# Patient Record
Sex: Male | Born: 1990
Health system: Southern US, Community
[De-identification: ages and names within clinical notes are randomized; demographics above are authoritative.]

## PROBLEM LIST (undated history)

## (undated) DIAGNOSIS — B20 Human immunodeficiency virus [HIV] disease: Secondary | ICD-10-CM

## (undated) DIAGNOSIS — Z21 Asymptomatic human immunodeficiency virus [HIV] infection status: Secondary | ICD-10-CM

## (undated) DIAGNOSIS — F909 Attention-deficit hyperactivity disorder, unspecified type: Secondary | ICD-10-CM

## (undated) HISTORY — DX: Asymptomatic human immunodeficiency virus (hiv) infection status: Z21

## (undated) HISTORY — DX: Human immunodeficiency virus (HIV) disease: B20

## (undated) HISTORY — PX: RECTAL SURGERY: SHX760

## (undated) HISTORY — DX: Attention-deficit hyperactivity disorder, unspecified type: F90.9

---

## 2017-04-02 ENCOUNTER — Encounter (HOSPITAL_COMMUNITY): Payer: Self-pay | Admitting: Emergency Medicine

## 2017-04-02 DIAGNOSIS — N342 Other urethritis: Secondary | ICD-10-CM | POA: Insufficient documentation

## 2017-04-02 DIAGNOSIS — F17210 Nicotine dependence, cigarettes, uncomplicated: Secondary | ICD-10-CM | POA: Insufficient documentation

## 2017-04-02 NOTE — ED Triage Notes (Signed)
Pt states he has burning with urination and clear penile discharge x 3 days

## 2017-04-03 ENCOUNTER — Emergency Department (HOSPITAL_COMMUNITY)
Admission: EM | Admit: 2017-04-03 | Discharge: 2017-04-03 | Disposition: A | Discharge status: Home / Self Care | Payer: Self-pay | Attending: Emergency Medicine | Admitting: Emergency Medicine

## 2017-04-03 DIAGNOSIS — N342 Other urethritis: Secondary | ICD-10-CM

## 2017-04-03 LAB — URINALYSIS, ROUTINE W REFLEX MICROSCOPIC
BILIRUBIN URINE: NEGATIVE
Glucose, UA: NEGATIVE mg/dL
Ketones, ur: NEGATIVE mg/dL
Nitrite: NEGATIVE
PROTEIN: 100 mg/dL — AB
SPECIFIC GRAVITY, URINE: 1.02 (ref 1.005–1.030)
SQUAMOUS EPITHELIAL / LPF: NONE SEEN
pH: 6 (ref 5.0–8.0)

## 2017-04-03 MED ORDER — AZITHROMYCIN 250 MG PO TABS
1000.0000 mg | ORAL_TABLET | Freq: Once | ORAL | Status: AC
Start: 2017-04-03 — End: 2017-04-03
  Administered 2017-04-03: 1000 mg via ORAL
  Filled 2017-04-03: qty 4

## 2017-04-03 MED ORDER — LIDOCAINE HCL 1 % IJ SOLN
INTRAMUSCULAR | Status: AC
  Administered 2017-04-03: 1 mL
  Filled 2017-04-03: qty 20

## 2017-04-03 MED ORDER — CEFTRIAXONE SODIUM 250 MG IJ SOLR
250.0000 mg | Freq: Once | INTRAMUSCULAR | Status: AC
  Administered 2017-04-03: 250 mg via INTRAMUSCULAR
  Filled 2017-04-03: qty 250

## 2017-04-03 NOTE — ED Provider Notes (Signed)
[facility]-EMERGENCY DEPT Provider Note: Lowella DellJ. Lane Kaelum Kissick, MD, FACEP  CSN: [id number] MRN: [id number] ARRIVAL: 04/02/17 at 2317 ROOM: WA04/WA04   CHIEF COMPLAINT  Penile Discharge   HISTORY OF PRESENT ILLNESS  Brett Carter is a 26 y.o. male with a two-day history of urethral discharge and burning with urination. He rates the burning as an 8 out of 10. He denies abdominal pain, nausea or vomiting. He characterizes the symptoms as like previous gonorrhea.   History reviewed. No pertinent past medical history.  Past Surgical History:  Procedure Laterality Date  . RECTAL SURGERY      Family History  Problem Relation Age of Onset  . Diabetes Other   . Hypertension Other     Social History  Substance Use Topics  . Smoking status: Current Every Day Smoker    Types: Cigarettes  . Smokeless tobacco: Never Used  . Alcohol use Yes    Prior to Admission medications   Not on File    Allergies Depakote [valproic acid]; Lexapro [escitalopram oxalate]; Metadate cd [methylphenidate]; and Zyprexa [olanzapine]   REVIEW OF SYSTEMS  Negative except as noted here or in the History of Present Illness.   PHYSICAL EXAMINATION  Initial Vital Signs Blood pressure 132/86, pulse 79, temperature 98.6 F (37 C), temperature source Oral, resp. rate 18, SpO2 100 %.  Examination General: Well-developed, well-nourished male in no acute distress; appearance consistent with age of record HENT: normocephalic; atraumatic Eyes: pupils equal, round and reactive to light; extraocular muscles intact Neck: supple Heart: regular rate and rhythm Lungs: clear to auscultation bilaterally Abdomen: soft; nondistended; nontender; bowel sounds present GU: No CVA tenderness; tanner 5 male, circumcised; yellowish urethral discharge noted Extremities: No deformity; full range of motion Neurologic: Awake, alert and oriented; motor function intact in all extremities and symmetric; no facial droop Skin: Warm and  dry Psychiatric: Normal mood and affect   RESULTS  Summary of this visit's results, reviewed by myself:   EKG Interpretation  Date/Time:    Ventricular Rate:    PR Interval:    QRS Duration:   QT Interval:    QTC Calculation:   R Axis:     Text Interpretation:        Laboratory Studies: Results for orders placed or performed during the hospital encounter of 04/03/17 (from the past 24 hour(s))  Urinalysis, Routine w reflex microscopic     Status: Abnormal   Collection Time: 04/03/17 12:17 AM  Result Value Ref Range   Color, Urine YELLOW YELLOW   APPearance HAZY (A) CLEAR   Specific Gravity, Urine 1.020 1.005 - 1.030   pH 6.0 5.0 - 8.0   Glucose, UA NEGATIVE NEGATIVE mg/dL   Hgb urine dipstick SMALL (A) NEGATIVE   Bilirubin Urine NEGATIVE NEGATIVE   Ketones, ur NEGATIVE NEGATIVE mg/dL   Protein, ur 562100 (A) NEGATIVE mg/dL   Nitrite NEGATIVE NEGATIVE   Leukocytes, UA LARGE (A) NEGATIVE   RBC / HPF 6-30 0 - 5 RBC/hpf   WBC, UA TOO NUMEROUS TO COUNT 0 - 5 WBC/hpf   Bacteria, UA RARE (A) NONE SEEN   Squamous Epithelial / LPF NONE SEEN NONE SEEN   Mucous PRESENT    Imaging Studies: No results found.  ED COURSE  Nursing notes and initial vitals signs, including pulse oximetry, reviewed.  Vitals:   04/02/17 2348  BP: 132/86  Pulse: 79  Resp: 18  Temp: 98.6 F (37 C)  TempSrc: Oral  SpO2: 100%    PROCEDURES  ED DIAGNOSES     ICD-10-CM   1. Urethritis N34.2        Armeda Plumb, MD 04/03/17 1610

## 2017-04-04 LAB — URINE CULTURE: Culture: NO GROWTH

## 2017-04-04 LAB — GC/CHLAMYDIA PROBE AMP (~~LOC~~) NOT AT ARMC
Chlamydia: POSITIVE — AB
Neisseria Gonorrhea: POSITIVE — AB

## 2017-11-09 ENCOUNTER — Emergency Department (HOSPITAL_COMMUNITY)
Admission: EM | Admit: 2017-11-09 | Discharge: 2017-11-09 | Disposition: A | Discharge status: Home / Self Care | Payer: Self-pay | Attending: Emergency Medicine | Admitting: Emergency Medicine

## 2017-11-09 ENCOUNTER — Encounter (HOSPITAL_COMMUNITY): Payer: Self-pay | Admitting: Emergency Medicine

## 2017-11-09 DIAGNOSIS — R300 Dysuria: Secondary | ICD-10-CM | POA: Insufficient documentation

## 2017-11-09 DIAGNOSIS — R369 Urethral discharge, unspecified: Secondary | ICD-10-CM | POA: Insufficient documentation

## 2017-11-09 DIAGNOSIS — F17210 Nicotine dependence, cigarettes, uncomplicated: Secondary | ICD-10-CM | POA: Insufficient documentation

## 2017-11-09 LAB — URINALYSIS, ROUTINE W REFLEX MICROSCOPIC
BACTERIA UA: NONE SEEN
BILIRUBIN URINE: NEGATIVE
Glucose, UA: NEGATIVE mg/dL
Ketones, ur: NEGATIVE mg/dL
NITRITE: NEGATIVE
Protein, ur: 30 mg/dL — AB
SPECIFIC GRAVITY, URINE: 1.016 (ref 1.005–1.030)
pH: 6 (ref 5.0–8.0)

## 2017-11-09 MED ORDER — STERILE WATER FOR INJECTION IJ SOLN
INTRAMUSCULAR | Status: AC
  Administered 2017-11-09: 10 mL
  Filled 2017-11-09: qty 10

## 2017-11-09 MED ORDER — CEFTRIAXONE SODIUM 250 MG IJ SOLR
250.0000 mg | Freq: Once | INTRAMUSCULAR | Status: AC
  Administered 2017-11-09: 250 mg via INTRAMUSCULAR
  Filled 2017-11-09: qty 250

## 2017-11-09 MED ORDER — AZITHROMYCIN 250 MG PO TABS
1000.0000 mg | ORAL_TABLET | Freq: Once | ORAL | Status: AC
  Administered 2017-11-09: 1000 mg via ORAL
  Filled 2017-11-09: qty 4

## 2017-11-09 NOTE — Discharge Instructions (Signed)
The tests for gonorrhea and chlamydia were sent. It will take about 3 days for results to come back. If they are positive, your will receive a phone call. If positive, you do not need further treatment, as you were treated today.  You may follow up with the health department for any further concerns about STDs or penile discharge, they can test and treat for free.  Do not have unprotected sexual intercourse for 2 weeks, as this can cause reinfection.  Return to the ER if you develop fevers, chills, nausea, vomiting, or any new or concerning symptoms.

## 2017-11-09 NOTE — ED Provider Notes (Signed)
[facility]-EMERGENCY DEPT Provider Note   CSN: [id number] Arrival date & [id number]: 11/09/17  1338     History   Chief Complaint Chief Complaint  Patient presents with  . Penile Discharge    HPI Brett Carter is a 27 y.o. male presenting for evaluation of penile discharge.  Patient states he has had 3-4-day history of yellow penile discharge.  He also reports dysuria, although this is intermittent.  He denies fevers, chills, chest pain, shortness breath, nausea, vomiting, or abdominal pain.  He denies hematuria or urinary frequency.  He is concerned that he might have an STD or UTI.  He is sexually active with one partner who is male.  His partner also has penile discharge.  He denies being tested for STDs in the past, although reports he has had treatment for gonorrhea in the past.  He does not want HIV or syphilis testing today.  He denies any other medical problems.  HPI  History reviewed. No pertinent past medical history.  There are no active problems to display for this patient.   Past Surgical History:  Procedure Laterality Date  . RECTAL SURGERY         Home Medications    Prior to Admission medications   Not on File    Family History Family History  Problem Relation Age of Onset  . Diabetes Other   . Hypertension Other     Social History Social History   Tobacco Use  . Smoking status: Current Every Day Smoker    Types: Cigarettes  . Smokeless tobacco: Never Used  Substance Use Topics  . Alcohol use: Yes  . Drug use: No     Allergies   Depakote [valproic acid]; Lexapro [escitalopram oxalate]; Metadate cd [methylphenidate]; and Zyprexa [olanzapine]   Review of Systems Review of Systems  Constitutional: Negative for chills and fever.  Genitourinary: Positive for discharge and dysuria. Negative for frequency, hematuria, penile pain, penile swelling, scrotal swelling and testicular pain.     Physical Exam Updated Vital  Signs BP 121/78 (BP Location: Right Arm)   Pulse 79   Temp 98.5 F (36.9 C) (Oral)   Resp 14   Ht 5\' 8"  (1.727 m)   Wt 72.6 kg (160 lb)   SpO2 99%   BMI 24.33 kg/m   Physical Exam  Constitutional: He is oriented to person, place, and time. He appears well-developed and well-nourished. No distress.  HENT:  Head: Normocephalic and atraumatic.  OP clear without erythema or exudate  Eyes: EOM are normal.  Neck: Normal range of motion.  Cardiovascular: Normal rate, regular rhythm and intact distal pulses.  Pulmonary/Chest: Effort normal and breath sounds normal. No respiratory distress. He has no wheezes.  Abdominal: Soft. He exhibits no distension and no mass. There is no tenderness. There is no guarding.  Genitourinary:  Genitourinary Comments: Chaperone present.  White discharge visualized from the penis.  No inguinal lymphadenopathy.  No penile or testicular swelling.  Musculoskeletal: Normal range of motion.  Neurological: He is alert and oriented to person, place, and time.  Skin: Skin is warm and dry.  Psychiatric: He has a normal mood and affect.  Nursing note and vitals reviewed.    ED Treatments / Results  Labs (all labs ordered are listed, but only abnormal results are displayed) Labs Reviewed  URINALYSIS, ROUTINE W REFLEX MICROSCOPIC - Abnormal; Notable for the following components:      Result Value   Hgb urine dipstick SMALL (*)  Protein, ur 30 (*)    Leukocytes, UA MODERATE (*)    Squamous Epithelial / LPF 0-5 (*)    All other components within normal limits  URINE CULTURE  GC/CHLAMYDIA PROBE AMP () NOT AT [facility]    EKG  EKG Interpretation None       Radiology No results found.  Procedures Procedures (including critical care time)  Medications Ordered in ED Medications  cefTRIAXone (ROCEPHIN) injection 250 mg (250 mg Intramuscular Given 11/09/17 1626)  azithromycin (ZITHROMAX) tablet 1,000 mg (1,000 mg Oral Given 11/09/17 1625)    sterile water (preservative free) injection (10 mLs  Given 11/09/17 1625)     Initial Impression / Assessment and Plan / ED Course  I have reviewed the triage vital signs and the nursing notes.  Pertinent labs & imaging results that were available during my care of the patient were reviewed by me and considered in my medical decision making (see chart for details).      Pt presenting with concerns for possible STD.  Discharge noted on exam.  UA obtained.  Pt understands that they have GC/Chlamydia cultures pending and that they will need to inform all sexual partners if results return positive. Pt has been treated prophylactically with azithromycin and Rocephin due to pts history and physical.   UA negative for infection.  Discussed with patient.  Patient to be discharged with instructions to follow up with health department. Discussed importance of using protection when sexually active.  At this time, patient appears safe for discharge.  Return precautions given.  Patient states he understands and agrees to plan.  Final Clinical Impressions(s) / ED Diagnoses   Final diagnoses:  Penile discharge  Dysuria    ED Discharge Orders    None       Alveria Apley, PA-C 11/09/17 2220    Maia Plan, MD 11/10/17 1106

## 2017-11-09 NOTE — ED Triage Notes (Signed)
Pt comes in with complaints of penile discharge that began 3-4 days ago.  Endorses dysuria. Last intercourse 7-8 days ago.  Pt ambulatory. A&O x4. No other complaints at this time.

## 2017-11-11 LAB — URINE CULTURE: Culture: NO GROWTH

## 2018-03-15 ENCOUNTER — Encounter (HOSPITAL_COMMUNITY): Payer: Self-pay | Admitting: Emergency Medicine

## 2018-03-15 ENCOUNTER — Emergency Department (HOSPITAL_COMMUNITY): Payer: Self-pay

## 2018-03-15 ENCOUNTER — Other Ambulatory Visit: Payer: Self-pay

## 2018-03-15 ENCOUNTER — Inpatient Hospital Stay (HOSPITAL_COMMUNITY)
Admission: EM | Admit: 2018-03-15 | Discharge: 2018-03-17 | DRG: 195 | Disposition: A | Discharge status: Home / Self Care | Payer: Self-pay | Attending: Internal Medicine | Admitting: Internal Medicine

## 2018-03-15 DIAGNOSIS — Z23 Encounter for immunization: Secondary | ICD-10-CM

## 2018-03-15 DIAGNOSIS — R1310 Dysphagia, unspecified: Secondary | ICD-10-CM | POA: Diagnosis present

## 2018-03-15 DIAGNOSIS — F329 Major depressive disorder, single episode, unspecified: Secondary | ICD-10-CM

## 2018-03-15 DIAGNOSIS — J181 Lobar pneumonia, unspecified organism: Principal | ICD-10-CM | POA: Diagnosis present

## 2018-03-15 DIAGNOSIS — Z833 Family history of diabetes mellitus: Secondary | ICD-10-CM

## 2018-03-15 DIAGNOSIS — Z21 Asymptomatic human immunodeficiency virus [HIV] infection status: Secondary | ICD-10-CM | POA: Diagnosis present

## 2018-03-15 DIAGNOSIS — Z8249 Family history of ischemic heart disease and other diseases of the circulatory system: Secondary | ICD-10-CM

## 2018-03-15 DIAGNOSIS — E860 Dehydration: Secondary | ICD-10-CM | POA: Diagnosis present

## 2018-03-15 DIAGNOSIS — E876 Hypokalemia: Secondary | ICD-10-CM | POA: Diagnosis present

## 2018-03-15 DIAGNOSIS — J189 Pneumonia, unspecified organism: Secondary | ICD-10-CM

## 2018-03-15 DIAGNOSIS — F17210 Nicotine dependence, cigarettes, uncomplicated: Secondary | ICD-10-CM | POA: Diagnosis present

## 2018-03-15 DIAGNOSIS — B20 Human immunodeficiency virus [HIV] disease: Secondary | ICD-10-CM

## 2018-03-15 DIAGNOSIS — Z888 Allergy status to other drugs, medicaments and biological substances status: Secondary | ICD-10-CM

## 2018-03-15 DIAGNOSIS — R000 Tachycardia, unspecified: Secondary | ICD-10-CM | POA: Diagnosis present

## 2018-03-15 DIAGNOSIS — Z8619 Personal history of other infectious and parasitic diseases: Secondary | ICD-10-CM

## 2018-03-15 LAB — COMPREHENSIVE METABOLIC PANEL
ALT: 22 U/L (ref 17–63)
AST: 35 U/L (ref 15–41)
Albumin: 3.5 g/dL (ref 3.5–5.0)
Alkaline Phosphatase: 47 U/L (ref 38–126)
Anion gap: 10 (ref 5–15)
BUN: 7 mg/dL (ref 6–20)
CHLORIDE: 100 mmol/L — AB (ref 101–111)
CO2: 28 mmol/L (ref 22–32)
Calcium: 8.7 mg/dL — ABNORMAL LOW (ref 8.9–10.3)
Creatinine, Ser: 1.09 mg/dL (ref 0.61–1.24)
Glucose, Bld: 98 mg/dL (ref 65–99)
POTASSIUM: 3 mmol/L — AB (ref 3.5–5.1)
Sodium: 138 mmol/L (ref 135–145)
Total Bilirubin: 0.8 mg/dL (ref 0.3–1.2)
Total Protein: 8.4 g/dL — ABNORMAL HIGH (ref 6.5–8.1)

## 2018-03-15 LAB — I-STAT CG4 LACTIC ACID, ED: LACTIC ACID, VENOUS: 1.54 mmol/L (ref 0.5–1.9)

## 2018-03-15 LAB — URINALYSIS, ROUTINE W REFLEX MICROSCOPIC
BILIRUBIN URINE: NEGATIVE
Glucose, UA: NEGATIVE mg/dL
KETONES UR: NEGATIVE mg/dL
Leukocytes, UA: NEGATIVE
NITRITE: NEGATIVE
PROTEIN: 30 mg/dL — AB
Specific Gravity, Urine: 1.01 (ref 1.005–1.030)
pH: 6 (ref 5.0–8.0)

## 2018-03-15 LAB — LACTATE DEHYDROGENASE: LDH: 212 U/L — AB (ref 98–192)

## 2018-03-15 LAB — RESPIRATORY PANEL BY PCR
Adenovirus: NOT DETECTED
BORDETELLA PERTUSSIS-RVPCR: NOT DETECTED
CHLAMYDOPHILA PNEUMONIAE-RVPPCR: NOT DETECTED
CORONAVIRUS HKU1-RVPPCR: NOT DETECTED
CORONAVIRUS NL63-RVPPCR: NOT DETECTED
Coronavirus 229E: NOT DETECTED
Coronavirus OC43: NOT DETECTED
Influenza A: NOT DETECTED
Influenza B: NOT DETECTED
MYCOPLASMA PNEUMONIAE-RVPPCR: NOT DETECTED
Metapneumovirus: NOT DETECTED
Parainfluenza Virus 1: NOT DETECTED
Parainfluenza Virus 2: NOT DETECTED
Parainfluenza Virus 3: NOT DETECTED
Parainfluenza Virus 4: NOT DETECTED
RHINOVIRUS / ENTEROVIRUS - RVPPCR: NOT DETECTED
Respiratory Syncytial Virus: NOT DETECTED

## 2018-03-15 LAB — CBC
HCT: 30.3 % — ABNORMAL LOW (ref 39.0–52.0)
HEMATOCRIT: 32.3 % — AB (ref 39.0–52.0)
HEMOGLOBIN: 9.9 g/dL — AB (ref 13.0–17.0)
Hemoglobin: 10.6 g/dL — ABNORMAL LOW (ref 13.0–17.0)
MCH: 28 pg (ref 26.0–34.0)
MCH: 28 pg (ref 26.0–34.0)
MCHC: 32.8 g/dL (ref 30.0–36.0)
MCHC: 32.7 g/dL (ref 30.0–36.0)
MCV: 85.2 fL (ref 78.0–100.0)
MCV: 85.8 fL (ref 78.0–100.0)
PLATELETS: 218 10*3/uL (ref 150–400)
Platelets: 197 10*3/uL (ref 150–400)
RBC: 3.79 MIL/uL — AB (ref 4.22–5.81)
RBC: 3.53 MIL/uL — ABNORMAL LOW (ref 4.22–5.81)
RDW: 12.7 % (ref 11.5–15.5)
RDW: 12.6 % (ref 11.5–15.5)
WBC: 7.6 10*3/uL (ref 4.0–10.5)
WBC: 8.4 10*3/uL (ref 4.0–10.5)

## 2018-03-15 LAB — RAPID HIV SCREEN (HIV 1/2 AB+AG)
HIV 1/2 ANTIBODIES: REACTIVE — AB
HIV-1 P24 Antigen - HIV24: NONREACTIVE

## 2018-03-15 LAB — LIPASE, BLOOD: LIPASE: 34 U/L (ref 11–51)

## 2018-03-15 LAB — CREATININE, SERUM: Creatinine, Ser: 1.05 mg/dL (ref 0.61–1.24)

## 2018-03-15 LAB — MAGNESIUM: MAGNESIUM: 1.5 mg/dL — AB (ref 1.7–2.4)

## 2018-03-15 LAB — URINALYSIS, MICROSCOPIC (REFLEX)
BACTERIA UA: NONE SEEN
RBC / HPF: NONE SEEN RBC/hpf (ref 0–5)
Squamous Epithelial / LPF: NONE SEEN (ref 0–5)

## 2018-03-15 LAB — MONONUCLEOSIS SCREEN: Mono Screen: NEGATIVE

## 2018-03-15 LAB — GROUP A STREP BY PCR: GROUP A STREP BY PCR: NOT DETECTED

## 2018-03-15 LAB — TROPONIN I: Troponin I: <0.03 ng/mL (ref <0.03)

## 2018-03-15 MED ORDER — PNEUMOCOCCAL VAC POLYVALENT 25 MCG/0.5ML IJ INJ
0.5000 mL | INJECTION | INTRAMUSCULAR | Status: AC
  Administered 2018-03-16: 0.5 mL via INTRAMUSCULAR
  Filled 2018-03-15: qty 0.5

## 2018-03-15 MED ORDER — AZITHROMYCIN 500 MG IV SOLR
500.0000 mg | INTRAVENOUS | Status: DC
  Administered 2018-03-16 – 2018-03-17 (×2): 500 mg via INTRAVENOUS
  Filled 2018-03-15 (×2): qty 500

## 2018-03-15 MED ORDER — SODIUM CHLORIDE 0.9 % IV SOLN
1.0000 g | Freq: Once | INTRAVENOUS | Status: AC
  Administered 2018-03-15: 1 g via INTRAVENOUS
  Filled 2018-03-15: qty 10

## 2018-03-15 MED ORDER — SODIUM CHLORIDE 0.9 % IV SOLN
INTRAVENOUS | Status: DC
Start: 2018-03-15 — End: 2018-03-17
  Administered 2018-03-15 – 2018-03-17 (×4): via INTRAVENOUS

## 2018-03-15 MED ORDER — POTASSIUM CHLORIDE 10 MEQ/100ML IV SOLN
10.0000 meq | INTRAVENOUS | Status: AC
  Administered 2018-03-15 – 2018-03-16 (×6): 10 meq via INTRAVENOUS
  Filled 2018-03-15 (×5): qty 100

## 2018-03-15 MED ORDER — IPRATROPIUM-ALBUTEROL 0.5-2.5 (3) MG/3ML IN SOLN: 3.0000 mL | Freq: Two times a day (BID) | RESPIRATORY_TRACT | Status: DC

## 2018-03-15 MED ORDER — SODIUM CHLORIDE 0.9 % IV BOLUS
2000.0000 mL | Freq: Once | INTRAVENOUS | Status: AC
  Administered 2018-03-15: 2000 mL via INTRAVENOUS

## 2018-03-15 MED ORDER — KETOROLAC TROMETHAMINE 30 MG/ML IJ SOLN
30.0000 mg | Freq: Four times a day (QID) | INTRAMUSCULAR | Status: DC | PRN
  Administered 2018-03-15: 30 mg via INTRAVENOUS
  Filled 2018-03-15: qty 1

## 2018-03-15 MED ORDER — ENOXAPARIN SODIUM 40 MG/0.4ML ~~LOC~~ SOLN
40.0000 mg | SUBCUTANEOUS | Status: DC
  Filled 2018-03-15: qty 0.4

## 2018-03-15 MED ORDER — BICTEGRAVIR-EMTRICITAB-TENOFOV 50-200-25 MG PO TABS
1.0000 | ORAL_TABLET | Freq: Every day | ORAL | Status: DC
  Administered 2018-03-15 – 2018-03-17 (×3): 1 via ORAL
  Filled 2018-03-15 (×3): qty 1

## 2018-03-15 MED ORDER — ACETAMINOPHEN 325 MG PO TABS
650.0000 mg | ORAL_TABLET | Freq: Once | ORAL | Status: AC | PRN
  Administered 2018-03-15: 650 mg via ORAL
  Filled 2018-03-15: qty 2

## 2018-03-15 MED ORDER — SODIUM CHLORIDE 0.9 % IV SOLN
500.0000 mg | Freq: Once | INTRAVENOUS | Status: AC
  Administered 2018-03-15: 500 mg via INTRAVENOUS
  Filled 2018-03-15: qty 500

## 2018-03-15 MED ORDER — ACETAMINOPHEN 325 MG PO TABS
650.0000 mg | ORAL_TABLET | Freq: Four times a day (QID) | ORAL | Status: DC | PRN
  Administered 2018-03-15 – 2018-03-17 (×2): 650 mg via ORAL
  Filled 2018-03-15 (×2): qty 2

## 2018-03-15 MED ORDER — SODIUM CHLORIDE 0.9 % IV BOLUS
1000.0000 mL | Freq: Once | INTRAVENOUS | Status: AC
  Administered 2018-03-15: 1000 mL via INTRAVENOUS

## 2018-03-15 MED ORDER — ONDANSETRON HCL 4 MG/2ML IJ SOLN: 4.0000 mg | Freq: Four times a day (QID) | INTRAMUSCULAR | Status: DC | PRN

## 2018-03-15 MED ORDER — IPRATROPIUM-ALBUTEROL 0.5-2.5 (3) MG/3ML IN SOLN
3.0000 mL | Freq: Four times a day (QID) | RESPIRATORY_TRACT | Status: DC
  Administered 2018-03-15: 3 mL via RESPIRATORY_TRACT
  Filled 2018-03-15: qty 3

## 2018-03-15 MED ORDER — GUAIFENESIN ER 600 MG PO TB12
600.0000 mg | ORAL_TABLET | Freq: Two times a day (BID) | ORAL | Status: DC
  Administered 2018-03-15 – 2018-03-17 (×4): 600 mg via ORAL
  Filled 2018-03-15 (×5): qty 1

## 2018-03-15 MED ORDER — SODIUM CHLORIDE 0.9 % IV SOLN
1.0000 g | INTRAVENOUS | Status: DC
  Administered 2018-03-16 – 2018-03-17 (×2): 1 g via INTRAVENOUS
  Filled 2018-03-15 (×2): qty 1

## 2018-03-15 MED ORDER — MAGNESIUM OXIDE 400 (241.3 MG) MG PO TABS
800.0000 mg | ORAL_TABLET | ORAL | Status: AC
  Administered 2018-03-15 – 2018-03-16 (×2): 800 mg via ORAL
  Filled 2018-03-15 (×2): qty 2

## 2018-03-15 NOTE — ED Notes (Signed)
Urinal @ bedside. Pt has been made aware of UA sample

## 2018-03-15 NOTE — H&P (Signed)
History and Physical    Brett Carter BJY:[id number] DOB: [birth year] DOA: 03/15/2018  PCP: Patient, No Pcp Per   Patient coming from: Home    Chief Complaint: chest pain, fever and chills  HPI: Brett Carter is a 27 y.o. male with no significant past medical history who came with complaints of fever, chills and chest pain since last 3 weeks.  Patient says he has been having these symptoms on and off since last 3 weeks.  The symptoms have gradually worsened for last few days.  He denies any sick contacts.  He is sexually active with men and says 1 of his partners has HIV.  He admits of having unprotected intercourse.  It was reported that patient has history of urethritis several months ago and was treated for chlamydia.  Patient also complains of severe midsternal chest pain which get worsens with movement and cough.  He also reports of having productive cough with blood-tinged sputum.  Patient lives with his friends and does not work.  He smokes about 3 to 4 cigarettes a day and occasionally drinks.  He denies any history of drug abuse. He complains of continuous chest pain  during my evaluation.  Was continuously coughing.  Denies any nausea, vomiting.  Says that he has few episodes of diarrhea.  Denies any abdominal pain, headache or dysuria.  ED Course: Patient look pretty sick on presentation.  He was febrile and tachycardic.  Blood pressure was stable on presentation.  Continues to complain of chest pain.  Patient was given IV fluids.  Started on antibiotics.  Chest x-ray showed left lower lobe pneumonia.  HIV rapid screening was positive.  ID consulted.  Review of Systems: As per HPI otherwise 10 point review of systems negative.    History reviewed. No pertinent past medical history.  Past Surgical History:  Procedure Laterality Date  . RECTAL SURGERY       reports that he has been smoking cigarettes.  He has never used smokeless tobacco. He reports that he drinks alcohol. He  reports that he does not use drugs.  Allergies  Allergen Reactions  . Depakote [Valproic Acid]   . Lexapro [Escitalopram Oxalate]   . Metadate Cd [Methylphenidate]   . Zyprexa [Olanzapine]     Family History  Problem Relation Age of Onset  . Diabetes Other   . Hypertension Other      Prior to Admission medications   Not on File    Physical Exam: Vitals:   03/15/18 1330 03/15/18 1400 03/15/18 1409 03/15/18 1430  BP: 125/88 129/90  (!) 123/94  Pulse: (!) 116 (!) 114  (!) 118  Resp: (!) 28 (!) 31  (!) 33  Temp:   (!) 102.8 F (39.3 C)   TempSrc:   Oral   SpO2: 97% 98%  94%    Constitutional: NAD, calm, comfortable Vitals:   03/15/18 1330 03/15/18 1400 03/15/18 1409 03/15/18 1430  BP: 125/88 129/90  (!) 123/94  Pulse: (!) 116 (!) 114  (!) 118  Resp: (!) 28 (!) 31  (!) 33  Temp:   (!) 102.8 F (39.3 C)   TempSrc:   Oral   SpO2: 97% 98%  94%   General: Sick looking Eyes: PERRL, lids and conjunctivae normal ENMT: Mucous membranes are moist. Posterior pharynx clear of any exudate or lesions.Normal dentition.  Neck: normal, supple, no masses, no thyromegaly Respiratory: Decreased air entry, basal crackles on the left lung. normal respiratory effort. No accessory muscle use.  Cardiovascular: Regular  rate and rhythm, no murmurs / rubs / gallops. No extremity edema. 2+ pedal pulses. No carotid bruits.  Abdomen: no tenderness, no masses palpated. No hepatosplenomegaly. Bowel sounds positive.  Musculoskeletal: no clubbing / cyanosis. No joint deformity upper and lower extremities. Good ROM, no contractures. Normal muscle tone.  Skin: no rashes, lesions, ulcers. No induration Neurologic: CN 2-12 grossly intact. Sensation intact, DTR normal. Strength 5/5 in all 4.  Psychiatric: Normal judgment and insight. Alert and oriented x 3. Normal mood.   Foley Catheter:None  Labs on Admission: I have personally reviewed following labs and imaging studies  CBC: Recent Labs  Lab  03/15/18 1142  WBC 7.6  HGB 10.6*  HCT 32.3*  MCV 85.2  PLT 218   Basic Metabolic Panel: Recent Labs  Lab 03/15/18 1142  NA 138  K 3.0*  CL 100*  CO2 28  GLUCOSE 98  BUN 7  CREATININE 1.09  CALCIUM 8.7*   GFR: CrCl cannot be calculated (Unknown ideal weight.). Liver Function Tests: Recent Labs  Lab 03/15/18 1142  AST 35  ALT 22  ALKPHOS 47  BILITOT 0.8  PROT 8.4*  ALBUMIN 3.5   Recent Labs  Lab 03/15/18 1142  LIPASE 34   No results for input(s): AMMONIA in the last 168 hours. Coagulation Profile: No results for input(s): INR, PROTIME in the last 168 hours. Cardiac Enzymes: No results for input(s): CKTOTAL, CKMB, CKMBINDEX, TROPONINI in the last 168 hours. BNP (last 3 results) No results for input(s): PROBNP in the last 8760 hours. HbA1C: No results for input(s): HGBA1C in the last 72 hours. CBG: No results for input(s): GLUCAP in the last 168 hours. Lipid Profile: No results for input(s): CHOL, HDL, LDLCALC, TRIG, CHOLHDL, LDLDIRECT in the last 72 hours. Thyroid Function Tests: No results for input(s): TSH, T4TOTAL, FREET4, T3FREE, THYROIDAB in the last 72 hours. Anemia Panel: No results for input(s): VITAMINB12, FOLATE, FERRITIN, TIBC, IRON, RETICCTPCT in the last 72 hours. Urine analysis:    Component Value Date/Time   COLORURINE YELLOW 11/09/2017 1601   APPEARANCEUR CLEAR 11/09/2017 1601   LABSPEC 1.016 11/09/2017 1601   PHURINE 6.0 11/09/2017 1601   GLUCOSEU NEGATIVE 11/09/2017 1601   HGBUR SMALL (A) 11/09/2017 1601   BILIRUBINUR NEGATIVE 11/09/2017 1601   KETONESUR NEGATIVE 11/09/2017 1601   PROTEINUR 30 (A) 11/09/2017 1601   NITRITE NEGATIVE 11/09/2017 1601   LEUKOCYTESUR MODERATE (A) 11/09/2017 1601    Radiological Exams on Admission: Dg Chest 2 View  Result Date: 03/15/2018 CLINICAL DATA:  Fever, productive cough and nausea for 3 weeks. EXAM: CHEST - 2 VIEW COMPARISON:  None. FINDINGS: Cardiomediastinal silhouette is normal.  Mediastinal contours appear intact. There is no evidence of pleural effusion or pneumothorax. Peribronchial airspace consolidation in the left lower lobe Osseous structures are without acute abnormality. Soft tissues are grossly normal. IMPRESSION: Peribronchial airspace consolidation the left lower lobe, concerning for bronchopneumonia. Electronically Signed   By: Ted Mcalpine M.D.   On: 03/15/2018 11:17     Assessment/Plan Principal Problem:   HIV (human immunodeficiency virus infection) (HCC) Active Problems:   Left lower lobe pneumonia (HCC)   Hypokalemia  HIV:New diagnosis .HIV 1/2 antibodies test positive.  ID following.  Started on [place].  We will follow-up with CD4/CD8. Patient is sexually active with male with unprotected anal intercourse.  Will check hepatitis panel.  He has history of chlamydia in the past.  Denies any genital/anal ulcers, discharge or lesions.  Left lobe pneumonia: We will treat as community acquired  pneumonia.  Started on ceftriaxone and azithromycin.  We will follow-up sputum culture, blood culture.  Patient presented with fever, chills and cough.  Continue bronchodilators as needed. Continue mucolytic's for cough.Continue droplet precaution.   Sinus tachycardia: Most likely secondary to dehydration,PNA.  Continue IV fluids.  Chest pain: Atypical.  Worsens on position and cough.  Reproducible chest pain on palpation.  Most likely this is pleuritic chest pain that started with pneumonia.  Continue symptomatic management.  Will check EKG and troponin.  Hypokalemia: Being supplemented.  Will check magnesium level     Severity of Illness: The appropriate patient status for this patient is OBSERVATION.    DVT prophylaxis:  Lovenox Code Status: Full Family Communication: None present at the bedside Consults called: ID called by ED team     Burnadette Pop MD [facility] Pager [phone]  If 7PM-7AM, please contact  night-coverage [email or web address] Password [email or web address]  03/15/2018, 2:52 PM

## 2018-03-15 NOTE — Progress Notes (Signed)
Dr. Dierdre Harness paged with magnesium result of 1.5.

## 2018-03-15 NOTE — ED Notes (Signed)
Report given to [employer], [phone].

## 2018-03-15 NOTE — ED Notes (Signed)
ED TO INPATIENT HANDOFF REPORT  Name/Age/Gender Brett Carter 27 y.o. male  Code Status    Code Status Orders  (From admission, onward)        Start     Ordered   03/15/18 1441  Full code  Continuous     03/15/18 1441    Code Status History    This patient has a current code status but no historical code status.      Home/SNF/Other Home  Chief Complaint headache/emesis  Level of Care/Admitting Diagnosis ED Disposition    ED Disposition Condition Comment   Admit  Hospital Area: [place] [[id number]]  Level of Care: Telemetry [5]  Admit to tele based on following criteria: Other see comments  Comments: tachycardia  Diagnosis: HIV (human immunodeficiency virus infection) [facility]) [[id number]]  Admitting Physician: Shelly Coss [[id number]]  Attending Physician: Shelly Coss [[id number]]  Estimated length of stay: past midnight tomorrow  Certification:: I certify this patient will need inpatient services for at least 2 midnights  PT Class (Do Not Modify): Inpatient [101]  PT Acc Code (Do Not Modify): Private [1]       Medical History History reviewed. No pertinent past medical history.  Allergies Allergies  Allergen Reactions  . Depakote [Valproic Acid]   . Lexapro [Escitalopram Oxalate]   . Metadate Cd [Methylphenidate]   . Zyprexa [Olanzapine]     IV Location/Drains/Wounds Patient Lines/Drains/Airways Status   Active Line/Drains/Airways    Name:   Placement date:   Placement time:   Site:   Days:   Peripheral IV 03/15/18 Right Forearm   03/15/18    1158    Forearm   less than 1   Peripheral IV 03/15/18 Right;Anterior;Medial Arm   03/15/18    1158    Arm   less than 1          Labs/Imaging Results for orders placed or performed during the hospital encounter of 03/15/18 (from the past 48 hour(s))  Lipase, blood     Status: None   Collection Time: 03/15/18 11:42 AM  Result Value Ref Range   Lipase 34 11 - 51 U/L    Comment: Performed  at [facility], [place] [address] [place], [place] [address]  Comprehensive metabolic panel     Status: Abnormal   Collection Time: 03/15/18 11:42 AM  Result Value Ref Range   Sodium 138 135 - 145 mmol/L   Potassium 3.0 (L) 3.5 - 5.1 mmol/L   Chloride 100 (L) 101 - 111 mmol/L   CO2 28 22 - 32 mmol/L   Glucose, Bld 98 65 - 99 mg/dL   BUN 7 6 - 20 mg/dL   Creatinine, Ser 1.09 0.61 - 1.24 mg/dL   Calcium 8.7 (L) 8.9 - 10.3 mg/dL   Total Protein 8.4 (H) 6.5 - 8.1 g/dL   Albumin 3.5 3.5 - 5.0 g/dL   AST 35 15 - 41 U/L   ALT 22 17 - 63 U/L   Alkaline Phosphatase 47 38 - 126 U/L   Total Bilirubin 0.8 0.3 - 1.2 mg/dL   GFR calc non Af Amer >60 >60 mL/min   GFR calc Af Amer >60 >60 mL/min    Comment: (NOTE) The eGFR has been calculated using the CKD EPI equation. This calculation has not been validated in all clinical situations. eGFR's persistently <60 mL/min signify possible Chronic Kidney Disease.    Anion gap 10 5 - 15    Comment: Performed at [facility],  [place] [address] [place], [place] [address]  CBC     Status: Abnormal   Collection Time: 03/15/18 11:42 AM  Result Value Ref Range   WBC 7.6 4.0 - 10.5 K/uL   RBC 3.79 (L) 4.22 - 5.81 MIL/uL   Hemoglobin 10.6 (L) 13.0 - 17.0 g/dL   HCT 32.3 (L) 39.0 - 52.0 %   MCV 85.2 78.0 - 100.0 fL   MCH 28.0 26.0 - 34.0 pg   MCHC 32.8 30.0 - 36.0 g/dL   RDW 12.7 11.5 - 15.5 %   Platelets 218 150 - 400 K/uL    Comment: Performed at [facility], [place] [address] [place], [place] [address]  Group A Strep by PCR     Status: None   Collection Time: 03/15/18 11:42 AM  Result Value Ref Range   Group A Strep by PCR NOT DETECTED NOT DETECTED    Comment: Performed at [facility], [place] [address] [place], [place] [address]  Lactate dehydrogenase     Status: Abnormal   Collection Time: 03/15/18 11:42 AM  Result Value Ref Range   LDH 212 (H) 98 - 192 U/L    Comment:  Performed at [facility], [place] [address], [place], [place] [address]  Mononucleosis screen     Status: None   Collection Time: 03/15/18 11:43 AM  Result Value Ref Range   Mono Screen NEGATIVE NEGATIVE    Comment: Performed at [facility], [place] [address] [place], [place] [address]  Rapid HIV screen (HIV 1/2 Ab+Ag)     Status: Abnormal   Collection Time: 03/15/18 11:43 AM  Result Value Ref Range   HIV-1 P24 Antigen - HIV24 NON REACTIVE NON REACTIVE   HIV 1/2 Antibodies Reactive (A) NON REACTIVE    Comment: RESULT CALLED TO, READ BACK BY AND VERIFIED WITH: R.Devlyn Retter RN 03/15/2018 1258 JR    Interpretation (HIV Ag Ab)      A reactive test result means that HIV 1 or HIV 2 antibodies have been detected in the specimen. The test result is interpreted as Preliminary Positive for HIV 1 and/or HIV 2 antibodies.    Comment: SENT FOR CONFIRMATION Performed at [facility], [place] [address], [place], [place] [address]   Blood culture (routine x 2)     Status: None (Preliminary result)   Collection Time: 03/15/18 11:43 AM  Result Value Ref Range   Specimen Description BLOOD RIGHT FOREARM    Special Requests      BOTTLES DRAWN AEROBIC AND ANAEROBIC Blood Culture results may not be optimal due to an excessive volume of blood received in culture bottles Performed at [facility], [place] [address] [place], [place] [address]    Culture PENDING    Report Status PENDING   Blood culture (routine x 2)     Status: None (Preliminary result)   Collection Time: 03/15/18 11:43 AM  Result Value Ref Range   Specimen Description BLOOD RIGHT FOREARM    Special Requests      BOTTLES DRAWN AEROBIC AND ANAEROBIC Blood Culture results may not be optimal due to an excessive volume of blood received in culture bottles Performed at [facility], [place] [address] [place], [place] [address]    Culture PENDING    Report Status  PENDING   I-Stat CG4 Lactic Acid, ED     Status: None   Collection Time: 03/15/18 11:57 AM  Result Value Ref Range  Lactic Acid, Venous 1.54 0.5 - 1.9 mmol/L  Urinalysis, Routine w reflex microscopic     Status: Abnormal   Collection Time: 03/15/18  1:27 PM  Result Value Ref Range   Color, Urine YELLOW YELLOW   APPearance CLEAR CLEAR   Specific Gravity, Urine 1.010 1.005 - 1.030   pH 6.0 5.0 - 8.0   Glucose, UA NEGATIVE NEGATIVE mg/dL   Hgb urine dipstick TRACE (A) NEGATIVE   Bilirubin Urine NEGATIVE NEGATIVE   Ketones, ur NEGATIVE NEGATIVE mg/dL   Protein, ur 30 (A) NEGATIVE mg/dL   Nitrite NEGATIVE NEGATIVE   Leukocytes, UA NEGATIVE NEGATIVE    Comment: Performed at [facility], [place] [address] [place], [place] [address]  Urinalysis, Microscopic (reflex)     Status: None   Collection Time: 03/15/18  1:27 PM  Result Value Ref Range   RBC / HPF NONE SEEN 0 - 5 RBC/hpf   WBC, UA 0-5 0 - 5 WBC/hpf   Bacteria, UA NONE SEEN NONE SEEN   Squamous Epithelial / LPF NONE SEEN 0 - 5    Comment: Performed at [facility], [place] [address], [place], [place] [address]   Dg Chest 2 View  Result Date: 03/15/2018 CLINICAL DATA:  Fever, productive cough and nausea for 3 weeks. EXAM: CHEST - 2 VIEW COMPARISON:  None. FINDINGS: Cardiomediastinal silhouette is normal. Mediastinal contours appear intact. There is no evidence of pleural effusion or pneumothorax. Peribronchial airspace consolidation in the left lower lobe Osseous structures are without acute abnormality. Soft tissues are grossly normal. IMPRESSION: Peribronchial airspace consolidation the left lower lobe, concerning for bronchopneumonia. Electronically Signed   By: Fidela Salisbury M.D.   On: 03/15/2018 11:17    Pending Labs Unresulted Labs (From admission, onward)   Start     Ordered   03/22/18 0500  Creatinine, serum  (enoxaparin (LOVENOX)    CrCl >/= 30 ml/min)  Weekly,   R    Comments:   while on enoxaparin therapy    03/15/18 1441   03/16/18 0500  HIV-1 RNA, PCR (Graph) Rfx/Geno EDI  Tomorrow morning,   R     03/15/18 1439   03/16/18 0500  Hepatitis B surface antigen  Tomorrow morning,   R     03/15/18 1439   03/16/18 0500  Hepatitis c antibody (reflex)  Tomorrow morning,   R     03/15/18 1439   03/16/18 0500  Hepatitis A antibody, total  Tomorrow morning,   R     03/15/18 1439   03/16/18 0500  Hepatitis B surface antibody  Tomorrow morning,   R     03/15/18 1439   03/16/18 0500  HLA B*5701  Tomorrow morning,   R     03/15/18 1439   03/16/18 4259  Basic metabolic panel  Tomorrow morning,   R     03/15/18 1441   03/16/18 0500  CBC  Tomorrow morning,   R     03/15/18 1441   03/15/18 1507  Strep pneumoniae urinary antigen  Once,   R     03/15/18 1506   03/15/18 1507  Legionella Pneumophila Serogp 1 Ur Ag  Once,   R     03/15/18 1506   03/15/18 1441  CBC  (enoxaparin (LOVENOX)    CrCl >/= 30 ml/min)  Once,   R    Comments:  Baseline for enoxaparin therapy IF NOT ALREADY DRAWN.  Notify MD if PLT < 100 K.  03/15/18 1441   03/15/18 1441  Creatinine, serum  (enoxaparin (LOVENOX)    CrCl >/= 30 ml/min)  Once,   R    Comments:  Baseline for enoxaparin therapy IF NOT ALREADY DRAWN.    03/15/18 1441   03/15/18 1439  Culture, expectorated sputum-assessment  Once,   R     03/15/18 1438   03/15/18 1437  Troponin I (q 6hr x 3)  Now then every 6 hours,   R     03/15/18 1438   03/15/18 1436  Magnesium  Add-on,   R     03/15/18 1435   03/15/18 1318  Cd4/cd8 (t-helper/t-suppressor cell)  STAT,   STAT     03/15/18 1317   03/15/18 1317  HIV 1 RNA quant-no reflex-bld  Once,   R     03/15/18 1317   03/15/18 1306  Respiratory Panel by PCR  (Respiratory virus panel)  Once,   R     03/15/18 1305   03/15/18 1143  HIV 1/2 AB - differentiation  Once,   STAT     03/15/18 1143   03/15/18 1133  Urine culture  STAT,   STAT     03/15/18 1132   03/15/18 1132  HIV antibody  Once,   STAT      03/15/18 1131      Vitals/Pain Today's Vitals   03/15/18 1430 03/15/18 1500 03/15/18 1530 03/15/18 1600  BP: (!) 123/94 111/71 120/85 120/81  Pulse: (!) 118 (!) 115 (!) 107 (!) 104  Resp: (!) 33 (!) 32 (!) 24 (!) 29  Temp:      TempSrc:      SpO2: 94% 92% 98% 95%  PainSc:        Isolation Precautions Droplet precaution  Medications Medications  azithromycin (ZITHROMAX) 500 mg in sodium chloride 0.9 % 250 mL IVPB (has no administration in time range)  cefTRIAXone (ROCEPHIN) 1 g in sodium chloride 0.9 % 100 mL IVPB (has no administration in time range)  guaiFENesin (MUCINEX) 12 hr tablet 600 mg (has no administration in time range)  potassium chloride 10 mEq in 100 mL IVPB (has no administration in time range)  ketorolac (TORADOL) 30 MG/ML injection 30 mg (has no administration in time range)  ipratropium-albuterol (DUONEB) 0.5-2.5 (3) MG/3ML nebulizer solution 3 mL (3 mLs Nebulization Not Given 03/15/18 1601)  ondansetron (ZOFRAN) injection 4 mg (has no administration in time range)  bictegravir-emtricitabine-tenofovir AF (BIKTARVY) 50-200-25 MG per tablet 1 tablet (has no administration in time range)  enoxaparin (LOVENOX) injection 40 mg (has no administration in time range)  0.9 %  sodium chloride infusion (has no administration in time range)  acetaminophen (TYLENOL) tablet 650 mg (650 mg Oral Given 03/15/18 1056)  sodium chloride 0.9 % bolus 1,000 mL (0 mLs Intravenous Stopped 03/15/18 1326)  cefTRIAXone (ROCEPHIN) 1 g in sodium chloride 0.9 % 100 mL IVPB (0 g Intravenous Stopped 03/15/18 1327)  azithromycin (ZITHROMAX) 500 mg in sodium chloride 0.9 % 250 mL IVPB (0 mg Intravenous Stopped 03/15/18 1327)  sodium chloride 0.9 % bolus 2,000 mL (0 mLs Intravenous Stopped 03/15/18 1511)    Mobility walks

## 2018-03-15 NOTE — ED Notes (Signed)
ED Provider at bedside. 

## 2018-03-15 NOTE — Progress Notes (Signed)
The patient's Magnesium level was 1.5. The patient had taken an anti-viral medication @ 1759 and the PCP was notified as Mg cannot be given within 2 hrs of taking an anti-viral medication. The night PCP was notified of Mg level.

## 2018-03-15 NOTE — ED Triage Notes (Signed)
Pt c/o headache, abd bloating, chills and feeling bad for couple weeks. Reports whenever he eats has diarrhea. Pt c/o cough with yellow-green mucous for 3 weeks as well.

## 2018-03-15 NOTE — Consult Note (Signed)
Date of Admission:  03/15/2018          Reason for Consult: newly diagnosed HIV and pneumonia    Referring Provider: Marijean Niemann Ward PA   Assessment:  1. HIV disease x 2 years at least 2. Pneumonia, presumed bacterial but anxiety about PCP 3. Dysphagia 4. Hx od Gonorrhea and Chlamydia   Plan:  1. Agree with Ceftriaxone and azithromycin 2. Check legionella ag, pneumocccal ag urine 3. Agree with resp virus panel 4. LDH to be added 5. Follow clinically and consider repeati CXR 6. Checking VL, CD4, and baseline labs 7. Start BIKTARVY tonight and engage [employer] 8. Involve [facility] CM to help pt enroll into HMAP potentially emergency HMAP  Principal Problem:   HIV (human immunodeficiency virus infection) (HCC) Active Problems:   Left lower lobe pneumonia (HCC)   Hypokalemia   Scheduled Meds: . bictegravir-emtricitabine-tenofovir AF  1 tablet Oral Daily  . enoxaparin (LOVENOX) injection  40 mg Subcutaneous Q24H  . guaiFENesin  600 mg Oral BID  . ipratropium-albuterol  3 mL Nebulization Q6H   Continuous Infusions: . sodium chloride    . [START ON 03/16/2018] azithromycin (ZITHROMAX) 500 MG IVPB (Vial-Mate [employer])    . [START ON 03/16/2018] cefTRIAXone (ROCEPHIN)  IV    . potassium chloride     PRN Meds:.ketorolac, ondansetron (ZOFRAN) IV  HPI: Brett Carter is a 27 y.o. male with newly diagnosed HIV based on rapid test. He presented to the ER with a 3 week history of cough with sputum production, fever and chills.     He was tachycardic, and febrile. He had CXR which showed   Peribronchial airspace consolidation the left lower lobe  His rapid HIV test is positive   Being an AA man who has sex with men he was already looking at lifetime estimated risk of contracting HIV of 50%. He tells me that he has not been sexually active for past 2 years. He last had sex with his ex boyfriend at that time with whom he has had on again, off again relationship. He states  that his ex had 6 other partners prior to their last intercourse and that his ex was just recently diagnosed with HIV and started on medications after having moved to [place].  Mr Chew does not recall ever being tested for HIV though if he was being screened for urethritis he assuredly should have been!  IN fact he was diagnosed with GC and chlamydia in OUR ER one year ago in June of 2018.Marland KitchenBUT NO HIV TEST was performed. This should be a NEVER event.  Epic has been built for many years with a BEST PRACTICE ALERT to flag providers to order and HIV test ANY and ALL times they are testing for STI's.  This was a very big MISSED opportunity because the provider must have REFUSED the BPA in 2018.         Review of Systems: Review of Systems  Constitutional: Positive for fever and malaise/fatigue. Negative for chills, diaphoresis and weight loss.  HENT: Positive for sore throat. Negative for congestion, hearing loss and tinnitus.   Eyes: Negative for blurred vision and double vision.  Respiratory: Positive for cough, sputum production and shortness of breath. Negative for wheezing.   Cardiovascular: Positive for chest pain and palpitations. Negative for leg swelling.  Gastrointestinal: Negative for abdominal pain, blood in stool, constipation, diarrhea, heartburn, melena, nausea and vomiting.  Genitourinary: Negative for dysuria, flank pain and hematuria.  Musculoskeletal: Negative  for back pain, falls, joint pain and myalgias.  Skin: Negative for itching and rash.  Neurological: Negative for dizziness, sensory change, focal weakness, loss of consciousness, weakness and headaches.  Endo/Heme/Allergies: Does not bruise/bleed easily.  Psychiatric/Behavioral: Positive for depression. Negative for memory loss and suicidal ideas. The patient is not nervous/anxious.     History reviewed. No pertinent past medical history.  Social History   Tobacco Use  . Smoking status: Current Every Day  Smoker    Types: Cigarettes  . Smokeless tobacco: Never Used  Substance Use Topics  . Alcohol use: Yes  . Drug use: No    Family History  Problem Relation Age of Onset  . Diabetes Other   . Hypertension Other    Allergies  Allergen Reactions  . Depakote [Valproic Acid]   . Lexapro [Escitalopram Oxalate]   . Metadate Cd [Methylphenidate]   . Zyprexa [Olanzapine]     OBJECTIVE: Blood pressure (!) 123/94, pulse (!) 118, temperature (!) 102.8 F (39.3 C), temperature source Oral, resp. rate (!) 33, SpO2 94 %.  Physical Exam  Constitutional: He is oriented to person, place, and time.  Non-toxic appearance.  HENT:  Head: Normocephalic and atraumatic.  Mouth/Throat: Oropharynx is clear and moist.  Eyes: Pupils are equal, round, and reactive to light. Conjunctivae and EOM are normal.  Neck: Normal range of motion. Neck supple. No JVD present.  Cardiovascular: Normal rate, regular rhythm and normal heart sounds. Exam reveals no gallop and no friction rub.  No murmur heard. Pulmonary/Chest: Effort normal. No respiratory distress. He has no wheezes.  Abdominal: Soft. Bowel sounds are normal. He exhibits no distension. There is no tenderness.  Musculoskeletal: Normal range of motion. He exhibits no edema or tenderness.  Neurological: He is alert and oriented to person, place, and time. He has normal strength.  Skin: Skin is warm and dry. No rash noted. No erythema. No pallor.  Psychiatric: He exhibits a depressed mood.    Lab Results Lab Results  Component Value Date   WBC 7.6 03/15/2018   HGB 10.6 (L) 03/15/2018   HCT 32.3 (L) 03/15/2018   MCV 85.2 03/15/2018   PLT 218 03/15/2018    Lab Results  Component Value Date   CREATININE 1.09 03/15/2018   BUN 7 03/15/2018   NA 138 03/15/2018   K 3.0 (L) 03/15/2018   CL 100 (L) 03/15/2018   CO2 28 03/15/2018    Lab Results  Component Value Date   ALT 22 03/15/2018   AST 35 03/15/2018   ALKPHOS 47 03/15/2018   BILITOT 0.8  03/15/2018     Microbiology: Recent Results (from the past 240 hour(s))  Group A Strep by PCR     Status: None   Collection Time: 03/15/18 11:42 AM  Result Value Ref Range Status   Group A Strep by PCR NOT DETECTED NOT DETECTED Final    Comment: Performed at [facility], [address], [place], [place] [address]    Acey Lav, MD [facility] [phone] pager  03/15/2018, 2:52 PM

## 2018-03-15 NOTE — ED Notes (Signed)
Infectious Disease MD at bedside talking with patient.

## 2018-03-15 NOTE — ED Provider Notes (Signed)
[facility]-EMERGENCY DEPT Provider Note   CSN: [id number] Arrival date & time: 03/15/18  1036     History   Chief Complaint Chief Complaint  Patient presents with  . Headache  . Abdominal Pain  . Diarrhea  . Cough    HPI Brett Carter is a 27 y.o. male.  The history is provided by the patient and medical records. No language interpreter was used.  Headache   Associated symptoms include a fever.  Abdominal Pain   Associated symptoms include fever, diarrhea, headaches and myalgias.  Diarrhea   Associated symptoms include abdominal pain, chills, headaches, myalgias and cough.  Cough  Associated symptoms include chills, headaches, sore throat and myalgias.   Brett Carter is a 27 y.o. male with no known medical history who presents to the Emergency Department complaining of subjective fevers x 3 weeks. Associated with intermittent productive cough, congestion, sore throat, myalgias.  He does also note some upper abdominal pain.  No nausea, vomiting, diarrhea.  He said that he improved after 2 to 3 days with TheraFlu and another over-the-counter cold medication, but then his symptoms returned.  Over the last several days, his symptoms have seemed to worsen.  No other medications taken prior to arrival for symptoms.  No known sick contacts.   History reviewed. No pertinent past medical history.  There are no active problems to display for this patient.   Past Surgical History:  Procedure Laterality Date  . RECTAL SURGERY          Home Medications    Prior to Admission medications   Not on File    Family History Family History  Problem Relation Age of Onset  . Diabetes Other   . Hypertension Other     Social History Social History   Tobacco Use  . Smoking status: Current Every Day Smoker    Types: Cigarettes  . Smokeless tobacco: Never Used  Substance Use Topics  . Alcohol use: Yes  . Drug use: No     Allergies   Depakote  [valproic acid]; Lexapro [escitalopram oxalate]; Metadate cd [methylphenidate]; and Zyprexa [olanzapine]   Review of Systems Review of Systems  Constitutional: Positive for chills and fever.  HENT: Positive for congestion and sore throat.   Respiratory: Positive for cough.   Gastrointestinal: Positive for abdominal pain and diarrhea.  Musculoskeletal: Positive for myalgias.  Neurological: Positive for headaches.  All other systems reviewed and are negative.    Physical Exam Updated Vital Signs BP 125/88   Pulse (!) 116   Temp (!) 102.8 F (39.3 C) (Oral)   Resp 15   SpO2 98%   Physical Exam  Constitutional: He is oriented to person, place, and time. He appears well-developed and well-nourished. No distress.  HENT:  Head: Normocephalic and atraumatic.  Oropharynx with erythema, no exudates.  No thrush noted.   Neck:  No meningeal signs noted.  Cardiovascular: Normal rate, regular rhythm and normal heart sounds.  No murmur heard. Pulmonary/Chest: Effort normal. No respiratory distress.  Crackles to left lower lung field.  Speaking in full sentences, but will have coughing fits.  Abdominal: Soft. He exhibits no distension.  No appreciable abdominal tenderness.  Musculoskeletal: Normal range of motion.  Neurological: He is alert and oriented to person, place, and time.  Skin: Skin is warm and dry.  Nursing note and vitals reviewed.    ED Treatments / Results  Labs (all labs ordered are listed, but only abnormal results are displayed) Labs Reviewed  COMPREHENSIVE METABOLIC PANEL - Abnormal; Notable for the following components:      Result Value   Potassium 3.0 (*)    Chloride 100 (*)    Calcium 8.7 (*)    Total Protein 8.4 (*)    All other components within normal limits  CBC - Abnormal; Notable for the following components:   RBC 3.79 (*)    Hemoglobin 10.6 (*)    HCT 32.3 (*)    All other components within normal limits  RAPID HIV SCREEN (HIV 1/2 AB+AG) -  Abnormal; Notable for the following components:   HIV 1/2 Antibodies Reactive (*)    All other components within normal limits  LACTATE DEHYDROGENASE - Abnormal; Notable for the following components:   LDH 212 (*)    All other components within normal limits  GROUP A STREP BY PCR  CULTURE, BLOOD (ROUTINE X 2)  CULTURE, BLOOD (ROUTINE X 2)  URINE CULTURE  RESPIRATORY PANEL BY PCR  LIPASE, BLOOD  MONONUCLEOSIS SCREEN  URINALYSIS, ROUTINE W REFLEX MICROSCOPIC  HIV ANTIBODY (ROUTINE TESTING)  HIV 1/2 AB - DIFFERENTIATION  HIV 1 RNA QUANT-NO REFLEX-BLD  CD4/CD8 (T-HELPER/T-SUPPRESSOR CELL)  I-STAT CG4 LACTIC ACID, ED    EKG None  Radiology Dg Chest 2 View  Result Date: 03/15/2018 CLINICAL DATA:  Fever, productive cough and nausea for 3 weeks. EXAM: CHEST - 2 VIEW COMPARISON:  None. FINDINGS: Cardiomediastinal silhouette is normal. Mediastinal contours appear intact. There is no evidence of pleural effusion or pneumothorax. Peribronchial airspace consolidation in the left lower lobe Osseous structures are without acute abnormality. Soft tissues are grossly normal. IMPRESSION: Peribronchial airspace consolidation the left lower lobe, concerning for bronchopneumonia. Electronically Signed   By: Ted Mcalpine M.D.   On: 03/15/2018 11:17    Procedures Procedures (including critical care time)  Medications Ordered in ED Medications  sodium chloride 0.9 % bolus 2,000 mL (2,000 mLs Intravenous New Bag/Given 03/15/18 1315)  acetaminophen (TYLENOL) tablet 650 mg (650 mg Oral Given 03/15/18 1056)  sodium chloride 0.9 % bolus 1,000 mL (0 mLs Intravenous Stopped 03/15/18 1326)  cefTRIAXone (ROCEPHIN) 1 g in sodium chloride 0.9 % 100 mL IVPB (0 g Intravenous Stopped 03/15/18 1327)  azithromycin (ZITHROMAX) 500 mg in sodium chloride 0.9 % 250 mL IVPB (0 mg Intravenous Stopped 03/15/18 1327)     Initial Impression / Assessment and Plan / ED Course  I have reviewed the triage vital signs and  the nursing notes.  Pertinent labs & imaging results that were available during my care of the patient were reviewed by me and considered in my medical decision making (see chart for details).    Brett Carter is a 27 y.o. male who presents to ED for persistent fevers x 3 weeks associated with upper abdominal pain, cough, congestion, sore throat. Febrile and tachycardic upon ED arrival. Blood cx's and fluids given. Crackles to left lower lung field. No abdominal tenderness appreciated. OP with erythema but no exudates or signs of thrush. CXR shows consolidation to left lower lob concerning for PNA. Started on azithro / rocephin for CAP. Rapid HIV obtained in ED today and + for AB. No known diagnosis of HIV. Discussed results with patient.   1:14 PM -  Consulted ID, Dr. Daiva Eves, who recommends adding in LDH to labs and ID will see.   Consulted hospitalist who will admit for further evaluation / management.   Patient seen by and discussed with Dr. Silverio Lay who agrees with treatment plan.    Final  Clinical Impressions(s) / ED Diagnoses   Final diagnoses:  HIV infection, unspecified symptom status (HCC)  Community acquired pneumonia of left lower lobe of lung [facility])    ED Discharge Orders    None       Ward, Chase Picket, PA-C 03/15/18 1416    Charlynne Pander, MD 03/15/18 1440

## 2018-03-16 DIAGNOSIS — J181 Lobar pneumonia, unspecified organism: Secondary | ICD-10-CM

## 2018-03-16 DIAGNOSIS — Z560 Unemployment, unspecified: Secondary | ICD-10-CM

## 2018-03-16 DIAGNOSIS — J189 Pneumonia, unspecified organism: Secondary | ICD-10-CM

## 2018-03-16 LAB — BASIC METABOLIC PANEL
Anion gap: 8 (ref 5–15)
BUN: 7 mg/dL (ref 6–20)
CHLORIDE: 109 mmol/L (ref 101–111)
CO2: 25 mmol/L (ref 22–32)
CREATININE: 0.99 mg/dL (ref 0.61–1.24)
Calcium: 8.2 mg/dL — ABNORMAL LOW (ref 8.9–10.3)
GFR calc Af Amer: >60 mL/min (ref >60)
GFR calc non Af Amer: >60 mL/min (ref >60)
Glucose, Bld: 93 mg/dL (ref 65–99)
POTASSIUM: 3.7 mmol/L (ref 3.5–5.1)
Sodium: 142 mmol/L (ref 135–145)

## 2018-03-16 LAB — CBC
HEMATOCRIT: 31.6 % — AB (ref 39.0–52.0)
Hemoglobin: 10.4 g/dL — ABNORMAL LOW (ref 13.0–17.0)
MCH: 28.2 pg (ref 26.0–34.0)
MCHC: 32.9 g/dL (ref 30.0–36.0)
MCV: 85.6 fL (ref 78.0–100.0)
PLATELETS: 210 10*3/uL (ref 150–400)
RBC: 3.69 MIL/uL — AB (ref 4.22–5.81)
RDW: 13.1 % (ref 11.5–15.5)
WBC: 5.4 10*3/uL (ref 4.0–10.5)

## 2018-03-16 LAB — URINE CULTURE: CULTURE: NO GROWTH

## 2018-03-16 LAB — EXPECTORATED SPUTUM ASSESSMENT W GRAM STAIN, RFLX TO RESP C

## 2018-03-16 LAB — HIV ANTIBODY (ROUTINE TESTING W REFLEX): HIV SCREEN 4TH GENERATION: REACTIVE — AB

## 2018-03-16 LAB — HIV-1 RNA QUANT-NO REFLEX-BLD
HIV 1 RNA Quant: 104000 copies/mL
LOG10 HIV-1 RNA: 5.017 {Log_copies}/mL

## 2018-03-16 LAB — EXPECTORATED SPUTUM ASSESSMENT W REFEX TO RESP CULTURE

## 2018-03-16 LAB — STREP PNEUMONIAE URINARY ANTIGEN: Strep Pneumo Urinary Antigen: NEGATIVE

## 2018-03-16 LAB — TROPONIN I: Troponin I: <0.03 ng/mL (ref <0.03)

## 2018-03-16 MED ORDER — IPRATROPIUM-ALBUTEROL 0.5-2.5 (3) MG/3ML IN SOLN: 3.0000 mL | RESPIRATORY_TRACT | Status: DC | PRN

## 2018-03-16 NOTE — Progress Notes (Signed)
   03/16/18 1112  Clinical Encounter Type  Visited With Patient and family together;Health care provider  Visit Type Initial;Spiritual support  Referral From Nurse;Patient  Consult/Referral To Chaplain  Spiritual Encounters  Spiritual Needs Prayer   Responding to a [facility] for prayer and major life transition.  Patient was in the bed and his husband was in the chair next to the bed.  Shared about his diagnosis and that he was some better today and just trying to process what all this means.  The couple were married in March.  Patient's family in [place], and I get the sense they are not close although they do know he is in the hospital.  Husbands family lives locally and I get the senses they are supportive.  Prayed with the couple.  Will follow and support as needed. Chaplain Agustin Cree

## 2018-03-16 NOTE — Progress Notes (Signed)
PROGRESS NOTE    Brett Carter  ZOX:[id number] DOB: 1990/11/14 DOA: 03/15/2018 PCP: Patient, No Pcp Per   Brief Narrative: Brett Carter is a 27 y.o. male with no significant past medical history who came with complaints of fever, chills and chest pain since last 3 weeks.  Patient said he has been having these symptoms on and off since last 3 weeks.  The symptoms had gradually worsened for last few days.  He was sexually active with men and says 1 of his partners has HIV.  He admits of having unprotected intercourse.  It was reported that patient has history of urethritis several months ago and was treated for chlamydia/gonorrhoea.    Assessment & Plan:   Principal Problem:   HIV (human immunodeficiency virus infection) (HCC) Active Problems:   Left lower lobe pneumonia (HCC)   Hypokalemia  HIV:New diagnosis .HIV 1/2 antibodies test positive.  ID following.  Started on [facility].  We will follow-up with CD4/CD8,viral load.  H/O WJX:BJYNWGN is sexually active with male with unprotected anal intercourse.  Will check hepatitis panel.  He has history of chlamydia in the past.  Denies any genital/anal ulcers, discharge or lesions.Follow up gonorrhea/chlamydia probe.  Left lobe pneumonia: We will treat as community acquired pneumonia.  Started on ceftriaxone and azithromycin.  We will follow-up sputum culture, blood culture.  Patient presented with fever, chills and cough.  Continue bronchodilators as needed. Continue mucolytic's for cough.Continue droplet precaution.  Follow-up Legionella/streptococcal antigen.  Sinus tachycardia: Resolved.Most likely secondary to dehydration,PNA.  Continue gentle  IV fluids.  Chest pain:Resolved.  Troponins negative.  Hypokalemia/hypomagnesemia: Supplemented.    DVT prophylaxis: Lovenox Code Status: Full Family Communication: None present at the bedside Disposition Plan: Likely home tomorrow   Consultants: Infectious disease  Procedures:  None  Antimicrobials: Azithromycin/ceftriaxone/Biktarvy  Subjective: Patient seen and examined the bedside this morning.  Remains comfortable today.  Denies any chest pain or shortness of breath.  Hemodynamically stable.  He is afebrile today this morning  Objective: Vitals:   03/15/18 1709 03/15/18 1933 03/15/18 2028 03/16/18 0404  BP:   116/67 121/80  Pulse:   87 73  Resp:   16 16  Temp:   98.1 F (36.7 C) 97.9 F (36.6 C)  TempSrc:   Oral Oral  SpO2:  99% 99% 100%  Weight: 67.8 kg (149 lb 6.4 oz)     Height:  (1.727 m)       Intake/Output Summary (Last 24 hours) at 03/16/2018 1238 Last data filed at 03/16/2018 1210 Gross per 24 hour  Intake 7006.67 ml  Output 2325 ml  Net 4681.67 ml   Filed Weights   03/15/18 1630 03/15/18 1709  Weight: 67.8 kg (149 lb 6.4 oz) 67.8 kg (149 lb 6.4 oz)    Examination:  General exam: Appears calm and comfortable ,Not in distress,average built HEENT:PERRL,Oral mucosa moist, Ear/Nose normal on gross exam Respiratory system: Bilateral equal air entry, normal vesicular breath sounds, no wheezes or crackles  Cardiovascular system: S1 & S2 heard, RRR. No JVD, murmurs, rubs, gallops or clicks. No pedal edema. Gastrointestinal system: Abdomen is nondistended, soft and nontender. No organomegaly or masses felt. Normal bowel sounds heard. Central nervous system: Alert and oriented. No focal neurological deficits. Extremities: No edema, no clubbing ,no cyanosis, distal peripheral pulses palpable. Skin: No rashes, lesions or ulcers,no icterus ,no pallor MSK: Normal muscle bulk,tone ,power Psychiatry: Judgement and insight appear normal. Mood & affect appropriate.     Data Reviewed: I have personally reviewed  following labs and imaging studies  CBC: Recent Labs  Lab 03/15/18 1142 03/15/18 1645 03/16/18 0509  WBC 7.6 8.4 5.4  HGB 10.6* 9.9* 10.4*  HCT 32.3* 30.3* 31.6*  MCV 85.2 85.8 85.6  PLT 218 197 210   Basic Metabolic  Panel: Recent Labs  Lab 03/15/18 1142 03/15/18 1645 03/16/18 0509  NA 138  --  142  K 3.0*  --  3.7  CL 100*  --  109  CO2 28  --  25  GLUCOSE 98  --  93  BUN 7  --  7  CREATININE 1.09 1.05 0.99  CALCIUM 8.7*  --  8.2*  MG  --  1.5*  --    GFR: Estimated Creatinine Clearance: 105.6 mL/min (by C-G formula based on SCr of 0.99 mg/dL). Liver Function Tests: Recent Labs  Lab 03/15/18 1142  AST 35  ALT 22  ALKPHOS 47  BILITOT 0.8  PROT 8.4*  ALBUMIN 3.5   Recent Labs  Lab 03/15/18 1142  LIPASE 34   No results for input(s): AMMONIA in the last 168 hours. Coagulation Profile: No results for input(s): INR, PROTIME in the last 168 hours. Cardiac Enzymes: Recent Labs  Lab 03/15/18 1645 03/15/18 2227 03/16/18 0509  TROPONINI <0.03 <0.03 <0.03   BNP (last 3 results) No results for input(s): PROBNP in the last 8760 hours. HbA1C: No results for input(s): HGBA1C in the last 72 hours. CBG: No results for input(s): GLUCAP in the last 168 hours. Lipid Profile: No results for input(s): CHOL, HDL, LDLCALC, TRIG, CHOLHDL, LDLDIRECT in the last 72 hours. Thyroid Function Tests: No results for input(s): TSH, T4TOTAL, FREET4, T3FREE, THYROIDAB in the last 72 hours. Anemia Panel: No results for input(s): VITAMINB12, FOLATE, FERRITIN, TIBC, IRON, RETICCTPCT in the last 72 hours. Sepsis Labs: Recent Labs  Lab 03/15/18 1157  LATICACIDVEN 1.54    Recent Results (from the past 240 hour(s))  Group A Strep by PCR     Status: None   Collection Time: 03/15/18 11:42 AM  Result Value Ref Range Status   Group A Strep by PCR NOT DETECTED NOT DETECTED Final    Comment: Performed at [facility], [address] [place], [place] [address]  Blood culture (routine x 2)     Status: None (Preliminary result)   Collection Time: 03/15/18 11:43 AM  Result Value Ref Range Status   Specimen Description BLOOD RIGHT FOREARM  Final   Special Requests   Final    BOTTLES DRAWN  AEROBIC AND ANAEROBIC Blood Culture results may not be optimal due to an excessive volume of blood received in culture bottles Performed at [facility], [address], [place], [place] [address]    Culture PENDING  Incomplete   Report Status PENDING  Incomplete  Blood culture (routine x 2)     Status: None (Preliminary result)   Collection Time: 03/15/18 11:43 AM  Result Value Ref Range Status   Specimen Description BLOOD RIGHT FOREARM  Final   Special Requests   Final    BOTTLES DRAWN AEROBIC AND ANAEROBIC Blood Culture results may not be optimal due to an excessive volume of blood received in culture bottles Performed at [facility], [address], [place], [place] [address]    Culture PENDING  Incomplete   Report Status PENDING  Incomplete  Respiratory Panel by PCR     Status: None   Collection Time: 03/15/18  1:06 PM  Result Value Ref Range Status  Adenovirus NOT DETECTED NOT DETECTED Final   Coronavirus 229E NOT DETECTED NOT DETECTED Final   Coronavirus HKU1 NOT DETECTED NOT DETECTED Final   Coronavirus NL63 NOT DETECTED NOT DETECTED Final   Coronavirus OC43 NOT DETECTED NOT DETECTED Final   Metapneumovirus NOT DETECTED NOT DETECTED Final   Rhinovirus / Enterovirus NOT DETECTED NOT DETECTED Final   Influenza A NOT DETECTED NOT DETECTED Final   Influenza B NOT DETECTED NOT DETECTED Final   Parainfluenza Virus 1 NOT DETECTED NOT DETECTED Final   Parainfluenza Virus 2 NOT DETECTED NOT DETECTED Final   Parainfluenza Virus 3 NOT DETECTED NOT DETECTED Final   Parainfluenza Virus 4 NOT DETECTED NOT DETECTED Final   Respiratory Syncytial Virus NOT DETECTED NOT DETECTED Final   Bordetella pertussis NOT DETECTED NOT DETECTED Final   Chlamydophila pneumoniae NOT DETECTED NOT DETECTED Final   Mycoplasma pneumoniae NOT DETECTED NOT DETECTED Final    Comment: Performed at [facility], [address] [place], [place] [address]  Urine  culture     Status: None   Collection Time: 03/15/18  1:27 PM  Result Value Ref Range Status   Specimen Description   Final    URINE, CLEAN CATCH Performed at [facility], [address], [place], [place] [address]    Special Requests   Final    NONE Performed at [facility], [address], [place], [place] [address]    Culture   Final    NO GROWTH Performed at [facility], [address], [place], [place] [address]    Report Status 03/16/2018 FINAL  Final  Culture, expectorated sputum-assessment     Status: None   Collection Time: 03/16/18 11:33 AM  Result Value Ref Range Status   Specimen Description SPUTUM  Final   Special Requests NONE  Final   Sputum evaluation   Final    THIS SPECIMEN IS ACCEPTABLE FOR SPUTUM CULTURE Performed at [facility], [address], [place], [place] [address]    Report Status 03/16/2018 FINAL  Final         Radiology Studies: Dg Chest 2 View  Result Date: 03/15/2018 CLINICAL DATA:  Fever, productive cough and nausea for 3 weeks. EXAM: CHEST - 2 VIEW COMPARISON:  None. FINDINGS: Cardiomediastinal silhouette is normal. Mediastinal contours appear intact. There is no evidence of pleural effusion or pneumothorax. Peribronchial airspace consolidation in the left lower lobe Osseous structures are without acute abnormality. Soft tissues are grossly normal. IMPRESSION: Peribronchial airspace consolidation the left lower lobe, concerning for bronchopneumonia. Electronically Signed   By: Ted Mcalpine M.D.   On: 03/15/2018 11:17        Scheduled Meds: . bictegravir-emtricitabine-tenofovir AF  1 tablet Oral Daily  . enoxaparin (LOVENOX) injection  40 mg Subcutaneous Q24H  . guaiFENesin  600 mg Oral BID   Continuous Infusions: . sodium chloride 100 mL/hr at 03/16/18 1210  . azithromycin (ZITHROMAX) 500 MG IVPB (Vial-Mate [employer]) Stopped (03/16/18 1028)  . cefTRIAXone  (ROCEPHIN)  IV Stopped (03/16/18 0921)     LOS: 1 day    Time spent: 25 mins.More than 50% of that time was spent in counseling and/or coordination of care.      Burnadette Pop, MD [facility] Pager [phone]  If 7PM-7AM, please contact night-coverage [email or web address] Password [facility] 03/16/2018, 12:38 PM

## 2018-03-16 NOTE — Progress Notes (Signed)
Subjective:  Patient already feels much better than yesterday.  His husband was in the room with him when I came to visit and his medical care was discussed with the husband present after obtaining permission from the patient  Antibiotics:  Anti-infectives (From admission, onward)   Start     Dose/Rate Route Frequency Ordered Stop   03/16/18 1000  azithromycin (ZITHROMAX) 500 mg in sodium chloride 0.9 % 250 mL IVPB     500 mg 250 mL/hr over 60 Minutes Intravenous Every 24 hours 03/15/18 1435     03/16/18 1000  cefTRIAXone (ROCEPHIN) 1 g in sodium chloride 0.9 % 100 mL IVPB     1 g 200 mL/hr over 30 Minutes Intravenous Every 24 hours 03/15/18 1435     03/15/18 1800  bictegravir-emtricitabine-tenofovir AF (BIKTARVY) 50-200-25 MG per tablet 1 tablet     1 tablet Oral Daily 03/15/18 1438     03/15/18 1145  cefTRIAXone (ROCEPHIN) 1 g in sodium chloride 0.9 % 100 mL IVPB     1 g 200 mL/hr over 30 Minutes Intravenous  Once 03/15/18 1131 03/15/18 1327   03/15/18 1145  azithromycin (ZITHROMAX) 500 mg in sodium chloride 0.9 % 250 mL IVPB     500 mg 250 mL/hr over 60 Minutes Intravenous  Once 03/15/18 1131 03/15/18 1327      Medications: Scheduled Meds: . bictegravir-emtricitabine-tenofovir AF  1 tablet Oral Daily  . enoxaparin (LOVENOX) injection  40 mg Subcutaneous Q24H  . guaiFENesin  600 mg Oral BID   Continuous Infusions: . sodium chloride 100 mL/hr at 03/16/18 1210  . azithromycin (ZITHROMAX) 500 MG IVPB (Vial-Mate [employer]) Stopped (03/16/18 1028)  . cefTRIAXone (ROCEPHIN)  IV Stopped (03/16/18 0921)   PRN Meds:.acetaminophen, ipratropium-albuterol, ketorolac, ondansetron (ZOFRAN) IV    Objective: Weight change:   Intake/Output Summary (Last 24 hours) at 03/16/2018 1418 Last data filed at 03/16/2018 1400 Gross per 24 hour  Intake 6056.67 ml  Output 2325 ml  Net 3731.67 ml   Blood pressure 118/83, pulse 73, temperature 98.2 F (36.8 C), temperature source Oral,  resp. rate 19, height  (1.727 m), weight 149 lb 6.4 oz (67.8 kg), SpO2 98 %. Temp:  [97.9 F (36.6 C)-103 F (39.4 C)] 98.2 F (36.8 C) (05/27 1312) Pulse Rate:  [73-118] 73 (05/27 1312) Resp:  [16-33] 19 (05/27 1312) BP: (111-124)/(67-94) 118/83 (05/27 1312) SpO2:  [92 %-100 %] 98 % (05/27 1312) Weight:  [149 lb 6.4 oz (67.8 kg)] 149 lb 6.4 oz (67.8 kg) (05/26 1709)  Physical Exam: General: Alert and awake, oriented x3, not in any acute distress. HEENT: anicteric sclera, EOMI CVS regular rate, normal  Chest: , Relatively clear to auscultation with slightly diminished breath sounds at the bases abdomen: soft non-distended,  Extremities: no edema or deformity noted bilaterally Skin: no rashes Neuro: nonfocal  CBC:    BMET Recent Labs    03/15/18 1142 03/15/18 1645 03/16/18 0509  NA 138  --  142  K 3.0*  --  3.7  CL 100*  --  109  CO2 28  --  25  GLUCOSE 98  --  93  BUN 7  --  7  CREATININE 1.09 1.05 0.99  CALCIUM 8.7*  --  8.2*     Liver Panel  Recent Labs    03/15/18 1142  PROT 8.4*  ALBUMIN 3.5  AST 35  ALT 22  ALKPHOS 47  BILITOT 0.8  Sedimentation Rate No results for input(s): ESRSEDRATE in the last 72 hours. C-Reactive Protein No results for input(s): CRP in the last 72 hours.  Micro Results: Recent Results (from the past 720 hour(s))  Group A Strep by PCR     Status: None   Collection Time: 03/15/18 11:42 AM  Result Value Ref Range Status   Group A Strep by PCR NOT DETECTED NOT DETECTED Final    Comment: Performed at [facility], [address], [place], [place] [address]  Blood culture (routine x 2)     Status: None (Preliminary result)   Collection Time: 03/15/18 11:43 AM  Result Value Ref Range Status   Specimen Description BLOOD RIGHT FOREARM  Final   Special Requests   Final    BOTTLES DRAWN AEROBIC AND ANAEROBIC Blood Culture results may not be optimal due to an excessive volume of blood received in  culture bottles Performed at [facility], [address] [place], [place] [address]    Culture   Final    NO GROWTH < 24 HOURS Performed at [facility], [address], [place], [place] [address]    Report Status PENDING  Incomplete  Blood culture (routine x 2)     Status: None (Preliminary result)   Collection Time: 03/15/18 11:43 AM  Result Value Ref Range Status   Specimen Description BLOOD RIGHT FOREARM  Final   Special Requests   Final    BOTTLES DRAWN AEROBIC AND ANAEROBIC Blood Culture results may not be optimal due to an excessive volume of blood received in culture bottles Performed at [facility], [address], [place], [place] [address]    Culture   Final    NO GROWTH < 24 HOURS Performed at [facility], [address] [place], [place] [address]    Report Status PENDING  Incomplete  Respiratory Panel by PCR     Status: None   Collection Time: 03/15/18  1:06 PM  Result Value Ref Range Status   Adenovirus NOT DETECTED NOT DETECTED Final   Coronavirus 229E NOT DETECTED NOT DETECTED Final   Coronavirus HKU1 NOT DETECTED NOT DETECTED Final   Coronavirus NL63 NOT DETECTED NOT DETECTED Final   Coronavirus OC43 NOT DETECTED NOT DETECTED Final   Metapneumovirus NOT DETECTED NOT DETECTED Final   Rhinovirus / Enterovirus NOT DETECTED NOT DETECTED Final   Influenza A NOT DETECTED NOT DETECTED Final   Influenza B NOT DETECTED NOT DETECTED Final   Parainfluenza Virus 1 NOT DETECTED NOT DETECTED Final   Parainfluenza Virus 2 NOT DETECTED NOT DETECTED Final   Parainfluenza Virus 3 NOT DETECTED NOT DETECTED Final   Parainfluenza Virus 4 NOT DETECTED NOT DETECTED Final   Respiratory Syncytial Virus NOT DETECTED NOT DETECTED Final   Bordetella pertussis NOT DETECTED NOT DETECTED Final   Chlamydophila pneumoniae NOT DETECTED NOT DETECTED Final   Mycoplasma pneumoniae NOT DETECTED NOT DETECTED Final    Comment: Performed at  [facility], [address], [place], [place] [address]  Urine culture     Status: None   Collection Time: 03/15/18  1:27 PM  Result Value Ref Range Status   Specimen Description   Final    URINE, CLEAN CATCH Performed at [facility], [address] [place], [place] [address]    Special Requests   Final    NONE Performed at [facility], [address], [place], [place] [address]  Culture   Final    NO GROWTH Performed at [facility], [address] [place], [place] [address]    Report Status 03/16/2018 FINAL  Final  Culture, expectorated sputum-assessment     Status: None   Collection Time: 03/16/18 11:33 AM  Result Value Ref Range Status   Specimen Description SPUTUM  Final   Special Requests NONE  Final   Sputum evaluation   Final    THIS SPECIMEN IS ACCEPTABLE FOR SPUTUM CULTURE Performed at [facility], [address], [place], [place] [address]    Report Status 03/16/2018 FINAL  Final    Studies/Results: Dg Chest 2 View  Result Date: 03/15/2018 CLINICAL DATA:  Fever, productive cough and nausea for 3 weeks. EXAM: CHEST - 2 VIEW COMPARISON:  None. FINDINGS: Cardiomediastinal silhouette is normal. Mediastinal contours appear intact. There is no evidence of pleural effusion or pneumothorax. Peribronchial airspace consolidation in the left lower lobe Osseous structures are without acute abnormality. Soft tissues are grossly normal. IMPRESSION: Peribronchial airspace consolidation the left lower lobe, concerning for bronchopneumonia. Electronically Signed   By: Ted Mcalpine M.D.   On: 03/15/2018 11:17      Assessment/Plan:  INTERVAL HISTORY: Since I visited him this morning his confirmatory aHIV antibody HIV came back positive which is not surprising, VL also already back at 104K   Principal Problem:   HIV (human immunodeficiency virus infection) (HCC) Active Problems:   Left lower lobe  pneumonia (HCC)   Hypokalemia    Brett Carter is a 27 y.o. male with newly diagnosed HIV and community-acquired pneumonia  #1 community acquired pneumonia continue ceftriaxone and azithromycin for now.  If he continues to improve we can step down to some oral medications but I want to make sure that he actually has these medications to leave the hospital with.  #2 newly diagnosed HIV disease: Viral load is quite high at 104,000.  I have some anxiety that he has had this for several years and that his immune system may be more compromised than we were hoping it would be.  CD4 count is still pending.  As I mentioned previously he was not tested for HIV when he was seen in in the emergency department and found to have gonorrhea and chlamydia.  As I mentioned before that should never have happened he should have had an HIV test when he was being tested for an STI.  In talking with his husband his husband had been seen in the ER in September a few months afterwards and had multiple symptoms that could be consistent with acute HIV including fevers and weight loss.  He thought that he had tested negative for HIV because he assumed that HIV was being done among all of the lab tests and radiographic imaging that he had.  However I looked into his chart with his permission and found that he was not tested for HIV.  I suspect the husband also has HIV and will need to be linked to care urgently.  Otherwise will blink the husband to being on preexposure prophylaxis while we try to get parries virus under control.  I will send a prescription of Biktarvy to [facility] and will engage with infectious disease pharmacy on trying to get [facility] advancing access 30-day supply.  With his viral load being 104,000 perhaps he can qualify for emergency HMA P.  He is not currently employed.   LOS: 1 day   Acey Lav 03/16/2018, 2:18  PM

## 2018-03-16 NOTE — Plan of Care (Signed)
  Problem: Health Behavior/Discharge Planning: Goal: Ability to manage health-related needs will improve Outcome: Progressing Note:  SW consulted for PCP.    Problem: Clinical Measurements: Goal: Ability to maintain clinical measurements within normal limits will improve Outcome: Progressing Note:  Afebrile. Labs WNL.  Goal: Will remain free from infection Outcome: Progressing Note:  IV abx.  Goal: Diagnostic test results will improve Outcome: Progressing Goal: Respiratory complications will improve Outcome: Progressing Note:  Lungs clear. Reports feeling "much better" than at admission.    Problem: Elimination: Goal: Will not experience complications related to bowel motility Outcome: Progressing

## 2018-03-16 NOTE — Care Management Note (Signed)
Case Management Note  Patient Details  Name: Brett Carter MRN: [id number] Date of Birth: [birth year]  Subjective/Objective:  27 y/o m admitted w/HIV. From home. CM referral for pcp, meds asst. Provided patient w/pcp listing-encouraged [facility]-will make own appt, also provided w/HMAP form-encouraged patient to complete as soon as possible for assistance w/meds-patient voiced understanding. Provided w/[facility] $4 med list.                   Action/Plan:d/c plan home.   Expected Discharge Date:                  Expected Discharge Plan:  Home/Self Care  In-House Referral:     Discharge planning Services  CM Consult, Indigent Health Clinic, Medication Assistance  Post Acute Care Choice:    Choice offered to:     DME Arranged:    DME Agency:     HH Arranged:    HH Agency:     Status of Service:  In process, will continue to follow  If discussed at Long Length of Stay Meetings, dates discussed:    Additional Comments:  Lanier Clam, RN 03/16/2018, 11:19 AM

## 2018-03-17 ENCOUNTER — Other Ambulatory Visit: Payer: Self-pay | Admitting: *Deleted

## 2018-03-17 ENCOUNTER — Other Ambulatory Visit: Payer: Self-pay | Admitting: Pharmacist Clinician (PhC)/ Clinical Pharmacy Specialist

## 2018-03-17 DIAGNOSIS — J189 Pneumonia, unspecified organism: Secondary | ICD-10-CM

## 2018-03-17 DIAGNOSIS — B20 Human immunodeficiency virus [HIV] disease: Secondary | ICD-10-CM

## 2018-03-17 DIAGNOSIS — J181 Lobar pneumonia, unspecified organism: Secondary | ICD-10-CM

## 2018-03-17 LAB — GC/CHLAMYDIA PROBE AMP (~~LOC~~) NOT AT ARMC
Chlamydia: NEGATIVE
NEISSERIA GONORRHEA: NEGATIVE

## 2018-03-17 LAB — CD4/CD8 (T-HELPER/T-SUPPRESSOR CELL)
CD4 absolute: 90 /uL — ABNORMAL LOW (ref 500–1900)
CD4%: 8 % — ABNORMAL LOW (ref 30.0–60.0)
CD8 T Cell Abs: 740 /uL (ref 230–1000)
CD8TOX: 63 % — AB (ref 15.0–40.0)
Ratio: 0.12 — ABNORMAL LOW (ref 1.0–3.0)
TOTAL LYMPHOCYTE COUNT: 1170 /uL (ref 1000–4000)

## 2018-03-17 LAB — HIV-1/2 AB - DIFFERENTIATION
HIV 1 Ab: POSITIVE — AB
HIV 2 Ab: NEGATIVE

## 2018-03-17 LAB — HEPATITIS B SURFACE ANTIGEN: HEP B S AG: NEGATIVE

## 2018-03-17 LAB — HCV COMMENT:

## 2018-03-17 LAB — HEPATITIS A ANTIBODY, TOTAL: HEP A TOTAL AB: NEGATIVE

## 2018-03-17 LAB — LEGIONELLA PNEUMOPHILA SEROGP 1 UR AG: L. pneumophila Serogp 1 Ur Ag: NEGATIVE

## 2018-03-17 LAB — HEPATITIS C ANTIBODY (REFLEX): HCV AB: 0.2 {s_co_ratio} (ref 0.0–0.9)

## 2018-03-17 LAB — HEPATITIS B SURFACE ANTIBODY, QUANTITATIVE: Hepatitis B-Post: 4.4 m[IU]/mL — ABNORMAL LOW (ref >9.9)

## 2018-03-17 MED ORDER — BICTEGRAVIR-EMTRICITAB-TENOFOV 50-200-25 MG PO TABS: 1.0000 | ORAL_TABLET | Freq: Every day | ORAL | 0 refills | Status: DC

## 2018-03-17 MED ORDER — AMOXICILLIN-POT CLAVULANATE 875-125 MG PO TABS: 1.0000 | ORAL_TABLET | Freq: Two times a day (BID) | ORAL | 0 refills | Status: AC

## 2018-03-17 MED ORDER — AMOXICILLIN-POT CLAVULANATE 875-125 MG PO TABS: 1.0000 | ORAL_TABLET | Freq: Two times a day (BID) | ORAL | 0 refills | Status: DC

## 2018-03-17 MED ORDER — SULFAMETHOXAZOLE-TRIMETHOPRIM 800-160 MG PO TABS: 1.0000 | ORAL_TABLET | Freq: Every day | ORAL | 0 refills | Status: DC

## 2018-03-17 MED FILL — BIKTARVY 50-200-25 MG TABS: 50-200-25 | 30 days supply | Qty: 30 | Fill #0

## 2018-03-17 NOTE — Plan of Care (Signed)
  Problem: Health Behavior/Discharge Planning: Goal: Ability to manage health-related needs will improve Outcome: Completed/Met   Problem: Clinical Measurements: Goal: Ability to maintain clinical measurements within normal limits will improve Outcome: Completed/Met Goal: Will remain free from infection Outcome: Completed/Met Goal: Diagnostic test results will improve Outcome: Completed/Met Goal: Respiratory complications will improve Outcome: Completed/Met   Problem: Elimination: Goal: Will not experience complications related to bowel motility Outcome: Completed/Met

## 2018-03-17 NOTE — Progress Notes (Signed)
Advancing Access for [employer].

## 2018-03-17 NOTE — Evaluation (Signed)
Physical Therapy One Time Evaluation Patient Details Name: Brett Carter MRN: [id number] DOB: 03/18/1991 Today's Date: 03/17/2018   History of Present Illness  27 y.o. male with no significant past medical history who came with complaints of fever, chills and chest pain since last 3 weeks and newly diagnosed with HIV as well as left lower lobe pneumonia  Clinical Impression  Patient evaluated by Physical Therapy with no further acute PT needs identified. All education has been completed and the patient has no further questions. Pt reports generalized weakness upon admission however feeling much improved today.  Pt ambulated in hallway good distance without assistance and SpO2 remained 96% on room air. No further follow-up Physical Therapy or equipment needs. PT is signing off. Thank you for this referral.     Follow Up Recommendations No PT follow up    Equipment Recommendations  None recommended by PT    Recommendations for Other Services       Precautions / Restrictions Precautions Precautions: None      Mobility  Bed Mobility Overal bed mobility: Independent                Transfers Overall transfer level: Independent                  Ambulation/Gait Ambulation/Gait assistance: Modified independent (Device/Increase time) Ambulation Distance (Feet): 280 Feet Assistive device: None Gait Pattern/deviations: WFL(Within Functional Limits)     General Gait Details: no unsteadiness or LOB observed, SPO2 monitored and remained 96% on room air during ambulation  Stairs            Wheelchair Mobility    Modified Rankin (Stroke Patients Only)       Balance Overall balance assessment: No apparent balance deficits (not formally assessed)                                           Pertinent Vitals/Pain Pain Assessment: No/denies pain    Home Living Family/patient expects to be discharged to:: Private residence Living Arrangements:  Spouse/significant other   Type of Home: Apartment Home Access: Stairs to enter   Secretary/administrator of Steps: flight Home Layout: One level Home Equipment: None      Prior Function Level of Independence: Independent               Hand Dominance        Extremity/Trunk Assessment   Upper Extremity Assessment Upper Extremity Assessment: Overall WFL for tasks assessed    Lower Extremity Assessment Lower Extremity Assessment: Overall WFL for tasks assessed    Cervical / Trunk Assessment Cervical / Trunk Assessment: Normal  Communication   Communication: No difficulties  Cognition Arousal/Alertness: Awake/alert Behavior During Therapy: WFL for tasks assessed/performed Overall Cognitive Status: Within Functional Limits for tasks assessed                                        General Comments      Exercises     Assessment/Plan    PT Assessment Patent does not need any further PT services  PT Problem List         PT Treatment Interventions      PT Goals (Current goals can be found in the Care Plan section)  Acute Rehab PT Goals PT Goal  Formulation: All assessment and education complete, DC therapy    Frequency     Barriers to discharge        Co-evaluation               AM-PAC PT "6 Clicks" Daily Activity  Outcome Measure Difficulty turning over in bed (including adjusting bedclothes, sheets and blankets)?: None Difficulty moving from lying on back to sitting on the side of the bed? : None Difficulty sitting down on and standing up from a chair with arms (e.g., wheelchair, bedside commode, etc,.)?: None Help needed moving to and from a bed to chair (including a wheelchair)?: None Help needed walking in hospital room?: None Help needed climbing 3-5 steps with a railing? : None 6 Click Score: 24    End of Session   Activity Tolerance: Patient tolerated treatment well Patient left: in bed;with call bell/phone within  reach;with family/visitor present Nurse Communication: Mobility status PT Visit Diagnosis: Difficulty in walking, not elsewhere classified (R26.2)    Time: 4098-1191 PT Time Calculation (min) (ACUTE ONLY): 8 min   Charges:   PT Evaluation $PT Eval Low Complexity: 1 Low     PT G CodesZenovia Jarred, PT, DPT 03/17/2018 Pager: [phone]  Maida Sale E 03/17/2018, 12:58 PM

## 2018-03-17 NOTE — Progress Notes (Signed)
Medications refilled per Dr Tyson Dense, emergency ADAP application sent 5/28 per [place] at [facility]. Andree Coss, RN

## 2018-03-17 NOTE — Progress Notes (Signed)
Subjective:  Patient continues to feel better  Antibiotics:  Anti-infectives (From admission, onward)   Start     Dose/Rate Route Frequency Ordered Stop   03/16/18 1000  azithromycin (ZITHROMAX) 500 mg in sodium chloride 0.9 % 250 mL IVPB     500 mg 250 mL/hr over 60 Minutes Intravenous Every 24 hours 03/15/18 1435     03/16/18 1000  cefTRIAXone (ROCEPHIN) 1 g in sodium chloride 0.9 % 100 mL IVPB     1 g 200 mL/hr over 30 Minutes Intravenous Every 24 hours 03/15/18 1435     03/15/18 1800  bictegravir-emtricitabine-tenofovir AF (BIKTARVY) 50-200-25 MG per tablet 1 tablet     1 tablet Oral Daily 03/15/18 1438     03/15/18 1145  cefTRIAXone (ROCEPHIN) 1 g in sodium chloride 0.9 % 100 mL IVPB     1 g 200 mL/hr over 30 Minutes Intravenous  Once 03/15/18 1131 03/15/18 1327   03/15/18 1145  azithromycin (ZITHROMAX) 500 mg in sodium chloride 0.9 % 250 mL IVPB     500 mg 250 mL/hr over 60 Minutes Intravenous  Once 03/15/18 1131 03/15/18 1327      Medications: Scheduled Meds: . bictegravir-emtricitabine-tenofovir AF  1 tablet Oral Daily  . enoxaparin (LOVENOX) injection  40 mg Subcutaneous Q24H  . guaiFENesin  600 mg Oral BID   Continuous Infusions: . sodium chloride 100 mL/hr at 03/17/18 0606  . azithromycin (ZITHROMAX) 500 MG IVPB (Vial-Mate [employer]) Stopped (03/16/18 1028)  . cefTRIAXone (ROCEPHIN)  IV 1 g (03/17/18 0945)   PRN Meds:.acetaminophen, ipratropium-albuterol, ketorolac, ondansetron (ZOFRAN) IV    Objective: Weight change:   Intake/Output Summary (Last 24 hours) at 03/17/2018 1008 Last data filed at 03/17/2018 0945 Gross per 24 hour  Intake 4050 ml  Output 2450 ml  Net 1600 ml   Blood pressure (!) 133/94, pulse (!) 52, temperature 98.1 F (36.7 C), temperature source Oral, resp. rate 16, height  (1.727 m), weight 149 lb 6.4 oz (67.8 kg), SpO2 100 %. Temp:  [98 F (36.7 C)-98.2 F (36.8 C)] 98.1 F (36.7 C) (05/28 0428) Pulse Rate:  [52-73]  52 (05/28 0428) Resp:  [16-19] 16 (05/28 0428) BP: (118-133)/(83-94) 133/94 (05/28 0428) SpO2:  [98 %-100 %] 100 % (05/28 0428)  Physical Exam: General: Alert and awake, oriented x3, not in any acute distress. HEENT: anicteric sclera, EOMI CVS regular rate, normal  Chest: , Relatively clear to auscultation no wrrr Extremities: no edema or deformity noted bilaterally Skin: no rashes Neuro: nonfocal  CBC:    BMET Recent Labs    03/15/18 1142 03/15/18 1645 03/16/18 0509  NA 138  --  142  K 3.0*  --  3.7  CL 100*  --  109  CO2 28  --  25  GLUCOSE 98  --  93  BUN 7  --  7  CREATININE 1.09 1.05 0.99  CALCIUM 8.7*  --  8.2*     Liver Panel  Recent Labs    03/15/18 1142  PROT 8.4*  ALBUMIN 3.5  AST 35  ALT 22  ALKPHOS 47  BILITOT 0.8       Sedimentation Rate No results for input(s): ESRSEDRATE in the last 72 hours. C-Reactive Protein No results for input(s): CRP in the last 72 hours.  Micro Results: Recent Results (from the past 720 hour(s))  Group A Strep by PCR     Status: None   Collection Time: 03/15/18 11:42 AM  Result  Value Ref Range Status   Group A Strep by PCR NOT DETECTED NOT DETECTED Final    Comment: Performed at [facility], [address] [place], [place] [address]  Blood culture (routine x 2)     Status: None (Preliminary result)   Collection Time: 03/15/18 11:43 AM  Result Value Ref Range Status   Specimen Description BLOOD RIGHT FOREARM  Final   Special Requests   Final    BOTTLES DRAWN AEROBIC AND ANAEROBIC Blood Culture results may not be optimal due to an excessive volume of blood received in culture bottles Performed at [facility], [address], [place], [place] [address]    Culture   Final    NO GROWTH < 24 HOURS Performed at [facility], [address], [place], [place] [address]    Report Status PENDING  Incomplete  Blood culture (routine x 2)     Status: None (Preliminary  result)   Collection Time: 03/15/18 11:43 AM  Result Value Ref Range Status   Specimen Description BLOOD RIGHT FOREARM  Final   Special Requests   Final    BOTTLES DRAWN AEROBIC AND ANAEROBIC Blood Culture results may not be optimal due to an excessive volume of blood received in culture bottles Performed at [facility], [address] [place], [place] [address]    Culture   Final    NO GROWTH < 24 HOURS Performed at [facility], [address], [place], [place] [address]    Report Status PENDING  Incomplete  Respiratory Panel by PCR     Status: None   Collection Time: 03/15/18  1:06 PM  Result Value Ref Range Status   Adenovirus NOT DETECTED NOT DETECTED Final   Coronavirus 229E NOT DETECTED NOT DETECTED Final   Coronavirus HKU1 NOT DETECTED NOT DETECTED Final   Coronavirus NL63 NOT DETECTED NOT DETECTED Final   Coronavirus OC43 NOT DETECTED NOT DETECTED Final   Metapneumovirus NOT DETECTED NOT DETECTED Final   Rhinovirus / Enterovirus NOT DETECTED NOT DETECTED Final   Influenza A NOT DETECTED NOT DETECTED Final   Influenza B NOT DETECTED NOT DETECTED Final   Parainfluenza Virus 1 NOT DETECTED NOT DETECTED Final   Parainfluenza Virus 2 NOT DETECTED NOT DETECTED Final   Parainfluenza Virus 3 NOT DETECTED NOT DETECTED Final   Parainfluenza Virus 4 NOT DETECTED NOT DETECTED Final   Respiratory Syncytial Virus NOT DETECTED NOT DETECTED Final   Bordetella pertussis NOT DETECTED NOT DETECTED Final   Chlamydophila pneumoniae NOT DETECTED NOT DETECTED Final   Mycoplasma pneumoniae NOT DETECTED NOT DETECTED Final    Comment: Performed at [facility], [address] [place], [place] [address]  Urine culture     Status: None   Collection Time: 03/15/18  1:27 PM  Result Value Ref Range Status   Specimen Description   Final    URINE, CLEAN CATCH Performed at [facility], [address] [place], [place] [address]    Special Requests    Final    NONE Performed at [facility], [address] [place], [place] [address]    Culture   Final    NO GROWTH Performed at [facility], [address], [place], [place] [address]    Report Status 03/16/2018 FINAL  Final  Culture, expectorated sputum-assessment     Status: None   Collection Time: 03/16/18 11:33 AM  Result Value Ref Range  Status   Specimen Description SPUTUM  Final   Special Requests NONE  Final   Sputum evaluation   Final    THIS SPECIMEN IS ACCEPTABLE FOR SPUTUM CULTURE Performed at [facility], [address], [place], [place] [address]    Report Status 03/16/2018 FINAL  Final  Culture, respiratory (NON-Expectorated)     Status: None (Preliminary result)   Collection Time: 03/16/18 11:33 AM  Result Value Ref Range Status   Specimen Description   Final    SPUTUM Performed at [facility], [address], [place], [place] [address]    Special Requests   Final    NONE Reflexed from [id number] Performed at [facility], [address] [place], [place] [address]    Gram Stain   Final    FEW SQUAMOUS EPITHELIAL CELLS PRESENT FEW WBC PRESENT,BOTH PMN AND MONONUCLEAR MODERATE GRAM POSITIVE RODS RARE YEAST FEW GRAM POSITIVE COCCI FEW GRAM NEGATIVE RODS Performed at [facility], [address], [place], [place] [address]    Culture PENDING  Incomplete   Report Status PENDING  Incomplete    Studies/Results: Dg Chest 2 View  Result Date: 03/15/2018 CLINICAL DATA:  Fever, productive cough and nausea for 3 weeks. EXAM: CHEST - 2 VIEW COMPARISON:  None. FINDINGS: Cardiomediastinal silhouette is normal. Mediastinal contours appear intact. There is no evidence of pleural effusion or pneumothorax. Peribronchial airspace consolidation in the left lower lobe Osseous structures are without acute abnormality. Soft tissues are grossly normal. IMPRESSION: Peribronchial airspace  consolidation the left lower lobe, concerning for bronchopneumonia. Electronically Signed   By: Ted Mcalpine M.D.   On: 03/15/2018 11:17      Assessment/Plan:  INTERVAL HISTORY: GIlead AA 30 day supply approved  Principal Problem:   HIV disease (HCC) Active Problems:   Left lower lobe pneumonia (HCC)   Hypokalemia   Community acquired pneumonia of left lower lobe of lung (HCC)    Brett Carter is a 27 y.o. male with newly diagnosed HIV and community-acquired pneumonia  #1 community acquired pneumonia: Give dose of ceftriaxone and azithromycin today = day3 and then send home w 2 more days of oral antibiotics. Agree with primary team chioce  #2 Newly diagnosed HIV disease: Viral load is quite high at 104,000.   BIKTARVY AA approved needs to be brought to bedside I have reached out to [employer] and Sheppard Coil to link him and his husband to care (husband will either need rx for HIV (if he has it which I suspect) or PrEP  Patient needs to get into [facility] to get enrolled in [facility] and [employer] ASAP  I want him seen by next week by a provider  I spent greater than 35 minutes with the patient including greater than 50% of time in face to face counsel of the patient and in coordination of his care with Merwyn Katos, [facility].    LOS: 2 days   Acey Lav 03/17/2018, 10:08 AM

## 2018-03-17 NOTE — Discharge Summary (Addendum)
Physician Discharge Summary  Brett Carter WUJ:[id number] DOB: 1991-02-22 DOA: 03/15/2018  PCP: Patient, No Pcp Per  Admit date: 03/15/2018 Discharge date: 03/17/2018  Admitted From: Home Disposition:  Home  Discharge Condition:Stable CODE STATUS:FULL Diet recommendation: Regular  Brief/Interim Summary: Brett Carter a 27 y.o.malewithnosignificant past medical history who came with complaints of fever, chills and chest pain since last 3 weeks. Patient said he has been having these symptoms on and off since last 3 weeks. The symptoms had gradually worsened for last few days. He was sexually active with a man who introduced as husband.He admits of having unprotected intercourse. It was reported that patient has history of urethritis several months ago and was treated for chlamydia/gonorrhoea. Patient was also found to have left lower lobe pneumonia on presentation.  Started on antibiotics. Patient's overall status is stable.  She is hemodynamically stable this morning.  Denies any complaints.  Looks comfortable.  ID was following him during his hospitalization.  He is stable to be discharged home today.  He has been provided 1 month supply of HIV medications.  He needs to follow-up with ID as an outpatient.  Following problems were addressed during his hospitalization:  HIV:New diagnosis .ID was  following. Started on [place].Viral load 104000.  Absolute CD4 count of 90.  Follow-up with ID as an outpatient. His partner should also be tested for HIV as an outpatient.He has also been started on Bactrim once a day for PCP prophylaxis.  H/O NFA:OZHYQMV is sexually active with male with unprotected anal intercourse.Negative hepatitis panel.He has history of chlamydia/gonorrhowa in the past.Denies any genital/anal ulcers, discharge or lesions.  Left lobe pneumonia:We will treat as community acquired pneumonia. Started on ceftriaxone and azithromycin. Patient presented with  fever,chills and cough. Cultures negative so far.  Started on oral antibiotics on discharge.  Sinus tachycardia: Resolved.Most likely secondary to dehydration,PNA.   Chest pain:Resolved.  Troponins negative.  Hypokalemia/hypomagnesemia:Supplemented.      Discharge Diagnoses:  Principal Problem:   HIV disease (HCC) Active Problems:   Left lower lobe pneumonia (HCC)   Hypokalemia   Community acquired pneumonia of left lower lobe of lung [facility])    Discharge Instructions  Discharge Instructions    Diet general   Complete by:  As directed    Discharge instructions   Complete by:  As directed    1) Take prescribed medications as instructed. 2)Follow up at [facility] for follow-up appointment. 3) Follow up with infectious disease services as an outpatient.   Increase activity slowly   Complete by:  As directed      Allergies as of 03/17/2018      Reactions   Depakote [valproic Acid]    Lexapro [escitalopram Oxalate]    Metadate Cd [methylphenidate]    Zyprexa [olanzapine]       Medication List    TAKE these medications   amoxicillin-clavulanate 875-125 MG tablet Commonly known as:  AUGMENTIN Take 1 tablet by mouth 2 (two) times daily for 4 days.   bictegravir-emtricitabine-tenofovir AF 50-200-25 MG Tabs tablet Commonly known as:  BIKTARVY Take 1 tablet by mouth daily.   sulfamethoxazole-trimethoprim 800-160 MG tablet Commonly known as:  BACTRIM DS,SEPTRA DS Take 1 tablet by mouth daily.      Follow-up Information    [facility]. Go on 03/27/2018.   Why:  9am;bring photo ID,discharge papers Contact information: [address] [place] [phone]  Allergies  Allergen Reactions  . Depakote [Valproic Acid]   . Lexapro [Escitalopram Oxalate]   . Metadate Cd [Methylphenidate]   . Zyprexa [Olanzapine]      Consultations: ID  Procedures/Studies: Dg Chest 2 View  Result Date: 03/15/2018 CLINICAL DATA:  Fever, productive cough and nausea for 3 weeks. EXAM: CHEST - 2 VIEW COMPARISON:  None. FINDINGS: Cardiomediastinal silhouette is normal. Mediastinal contours appear intact. There is no evidence of pleural effusion or pneumothorax. Peribronchial airspace consolidation in the left lower lobe Osseous structures are without acute abnormality. Soft tissues are grossly normal. IMPRESSION: Peribronchial airspace consolidation the left lower lobe, concerning for bronchopneumonia. Electronically Signed   By: Ted Mcalpine M.D.   On: 03/15/2018 11:17      Subjective:   Discharge Exam: Vitals:   03/16/18 2042 03/17/18 0428  BP: 130/89 (!) 133/94  Pulse: 65 (!) 52  Resp: 18 16  Temp: 98 F (36.7 C) 98.1 F (36.7 C)  SpO2: 99% 100%   Vitals:   03/16/18 0404 03/16/18 1312 03/16/18 2042 03/17/18 0428  BP: 121/80 118/83 130/89 (!) 133/94  Pulse: 73 73 65 (!) 52  Resp: Temp: 97.9 F (36.6 C) 98.2 F (36.8 C) 98 F (36.7 C) 98.1 F (36.7 C)  TempSrc: Oral Oral Oral Oral  SpO2: 100% 98% 99% 100%  Weight:      Height:        General: Pt is alert, awake, not in acute distress Cardiovascular: RRR, S1/S2 +, no rubs, no gallops Respiratory: CTA bilaterally, no wheezing, no rhonchi Abdominal: Soft, NT, ND, bowel sounds + Extremities: no edema, no cyanosis    The results of significant diagnostics from this hospitalization (including imaging, microbiology, ancillary and laboratory) are listed below for reference.     Microbiology: Recent Results (from the past 240 hour(s))  Group A Strep by PCR     Status: None   Collection Time: 03/15/18 11:42 AM  Result Value Ref Range Status   Group A Strep by PCR NOT DETECTED NOT DETECTED Final    Comment: Performed at [facility], [address], [place], [place] [address]  Blood culture (routine x 2)      Status: None (Preliminary result)   Collection Time: 03/15/18 11:43 AM  Result Value Ref Range Status   Specimen Description BLOOD RIGHT FOREARM  Final   Special Requests   Final    BOTTLES DRAWN AEROBIC AND ANAEROBIC Blood Culture results may not be optimal due to an excessive volume of blood received in culture bottles Performed at [facility], [address], [place], [place] [address]    Culture   Final    NO GROWTH < 24 HOURS Performed at [facility], [address] [place], [place] [address]    Report Status PENDING  Incomplete  Blood culture (routine x 2)     Status: None (Preliminary result)   Collection Time: 03/15/18 11:43 AM  Result Value Ref Range Status   Specimen Description BLOOD RIGHT FOREARM  Final   Special Requests   Final    BOTTLES DRAWN AEROBIC AND ANAEROBIC Blood Culture results may not be optimal due to an excessive volume of blood received in culture bottles Performed at [facility] [place], [address], [place], [place] [address]    Culture   Final    NO GROWTH < 24 HOURS Performed at [facility], [address], [place], [place] [address]  Report Status PENDING  Incomplete  Respiratory Panel by PCR     Status: None   Collection Time: 03/15/18  1:06 PM  Result Value Ref Range Status   Adenovirus NOT DETECTED NOT DETECTED Final   Coronavirus 229E NOT DETECTED NOT DETECTED Final   Coronavirus HKU1 NOT DETECTED NOT DETECTED Final   Coronavirus NL63 NOT DETECTED NOT DETECTED Final   Coronavirus OC43 NOT DETECTED NOT DETECTED Final   Metapneumovirus NOT DETECTED NOT DETECTED Final   Rhinovirus / Enterovirus NOT DETECTED NOT DETECTED Final   Influenza A NOT DETECTED NOT DETECTED Final   Influenza B NOT DETECTED NOT DETECTED Final   Parainfluenza Virus 1 NOT DETECTED NOT DETECTED Final   Parainfluenza Virus 2 NOT DETECTED NOT DETECTED Final   Parainfluenza Virus 3 NOT DETECTED NOT DETECTED Final    Parainfluenza Virus 4 NOT DETECTED NOT DETECTED Final   Respiratory Syncytial Virus NOT DETECTED NOT DETECTED Final   Bordetella pertussis NOT DETECTED NOT DETECTED Final   Chlamydophila pneumoniae NOT DETECTED NOT DETECTED Final   Mycoplasma pneumoniae NOT DETECTED NOT DETECTED Final    Comment: Performed at [facility], [address], [place], [place] [address]  Urine culture     Status: None   Collection Time: 03/15/18  1:27 PM  Result Value Ref Range Status   Specimen Description   Final    URINE, CLEAN CATCH Performed at [facility], [address], [place], [place] [address]    Special Requests   Final    NONE Performed at [facility], [address], [place], [place] [address]    Culture   Final    NO GROWTH Performed at [facility], [address] [place], [place] [address]    Report Status 03/16/2018 FINAL  Final  Culture, expectorated sputum-assessment     Status: None   Collection Time: 03/16/18 11:33 AM  Result Value Ref Range Status   Specimen Description SPUTUM  Final   Special Requests NONE  Final   Sputum evaluation   Final    THIS SPECIMEN IS ACCEPTABLE FOR SPUTUM CULTURE Performed at [facility], [address], [place], [place] [address]    Report Status 03/16/2018 FINAL  Final  Culture, respiratory (NON-Expectorated)     Status: None (Preliminary result)   Collection Time: 03/16/18 11:33 AM  Result Value Ref Range Status   Specimen Description   Final    SPUTUM Performed at [facility] [address], [place], [place] [address]    Special Requests   Final    NONE Reflexed from [id number] Performed at [facility], [address], [place], [place] [address]    Gram Stain   Final    FEW SQUAMOUS EPITHELIAL CELLS PRESENT FEW WBC PRESENT,BOTH PMN AND MONONUCLEAR MODERATE GRAM POSITIVE RODS RARE YEAST FEW GRAM POSITIVE COCCI FEW GRAM NEGATIVE  RODS    Culture   Final    CULTURE REINCUBATED FOR BETTER GROWTH Performed at [facility], [address] [place], [place] [address]    Report Status PENDING  Incomplete     Labs: BNP (last 3 results) No results for input(s): BNP in the last 8760 hours. Basic Metabolic Panel: Recent Labs  Lab 03/15/18 1142 03/15/18 1645 03/16/18 0509  NA 138  --  142  K 3.0*  --  3.7  CL 100*  --  109  CO2 28  --  25  GLUCOSE 98  --  93  BUN 7  --  7  CREATININE 1.09 1.05 0.99  CALCIUM 8.7*  --  8.2*  MG  --  1.5*  --    Liver Function Tests: Recent Labs  Lab 03/15/18 1142  AST 35  ALT 22  ALKPHOS 47  BILITOT 0.8  PROT 8.4*  ALBUMIN 3.5   Recent Labs  Lab 03/15/18 1142  LIPASE 34   No results for input(s): AMMONIA in the last 168 hours. CBC: Recent Labs  Lab 03/15/18 1142 03/15/18 1645 03/16/18 0509  WBC 7.6 8.4 5.4  HGB 10.6* 9.9* 10.4*  HCT 32.3* 30.3* 31.6*  MCV 85.2 85.8 85.6  PLT 218 197 210   Cardiac Enzymes: Recent Labs  Lab 03/15/18 1645 03/15/18 2227 03/16/18 0509  TROPONINI <0.03 <0.03 <0.03   BNP: Invalid input(s): POCBNP CBG: No results for input(s): GLUCAP in the last 168 hours. D-Dimer No results for input(s): DDIMER in the last 72 hours. Hgb A1c No results for input(s): HGBA1C in the last 72 hours. Lipid Profile No results for input(s): CHOL, HDL, LDLCALC, TRIG, CHOLHDL, LDLDIRECT in the last 72 hours. Thyroid function studies No results for input(s): TSH, T4TOTAL, T3FREE, THYROIDAB in the last 72 hours.  Invalid input(s): FREET3 Anemia work up No results for input(s): VITAMINB12, FOLATE, FERRITIN, TIBC, IRON, RETICCTPCT in the last 72 hours. Urinalysis    Component Value Date/Time   COLORURINE YELLOW 03/15/2018 1327   APPEARANCEUR CLEAR 03/15/2018 1327   LABSPEC 1.010 03/15/2018 1327   PHURINE 6.0 03/15/2018 1327   GLUCOSEU NEGATIVE 03/15/2018 1327   HGBUR TRACE (A) 03/15/2018 1327   BILIRUBINUR NEGATIVE 03/15/2018 1327    KETONESUR NEGATIVE 03/15/2018 1327   PROTEINUR 30 (A) 03/15/2018 1327   NITRITE NEGATIVE 03/15/2018 1327   LEUKOCYTESUR NEGATIVE 03/15/2018 1327   Sepsis Labs Invalid input(s): PROCALCITONIN,  WBC,  LACTICIDVEN Microbiology Recent Results (from the past 240 hour(s))  Group A Strep by PCR     Status: None   Collection Time: 03/15/18 11:42 AM  Result Value Ref Range Status   Group A Strep by PCR NOT DETECTED NOT DETECTED Final    Comment: Performed at [facility], [address], [place], [place] [address]  Blood culture (routine x 2)     Status: None (Preliminary result)   Collection Time: 03/15/18 11:43 AM  Result Value Ref Range Status   Specimen Description BLOOD RIGHT FOREARM  Final   Special Requests   Final    BOTTLES DRAWN AEROBIC AND ANAEROBIC Blood Culture results may not be optimal due to an excessive volume of blood received in culture bottles Performed at [facility], [address], [place], [place] [address]    Culture   Final    NO GROWTH < 24 HOURS Performed at [facility], [address], [place], [place] [address]    Report Status PENDING  Incomplete  Blood culture (routine x 2)     Status: None (Preliminary result)   Collection Time: 03/15/18 11:43 AM  Result Value Ref Range Status   Specimen Description BLOOD RIGHT FOREARM  Final   Special Requests   Final    BOTTLES DRAWN AEROBIC AND ANAEROBIC Blood Culture results may not be optimal due to an excessive volume of blood received in culture bottles Performed at [facility], [address], [place], [place] [address]    Culture   Final    NO GROWTH < 24 HOURS  Performed at [facility], [address] [place], [place] [address]    Report Status PENDING  Incomplete  Respiratory Panel by PCR     Status: None   Collection Time: 03/15/18  1:06 PM  Result Value Ref Range Status   Adenovirus NOT DETECTED NOT DETECTED Final   Coronavirus  229E NOT DETECTED NOT DETECTED Final   Coronavirus HKU1 NOT DETECTED NOT DETECTED Final   Coronavirus NL63 NOT DETECTED NOT DETECTED Final   Coronavirus OC43 NOT DETECTED NOT DETECTED Final   Metapneumovirus NOT DETECTED NOT DETECTED Final   Rhinovirus / Enterovirus NOT DETECTED NOT DETECTED Final   Influenza A NOT DETECTED NOT DETECTED Final   Influenza B NOT DETECTED NOT DETECTED Final   Parainfluenza Virus 1 NOT DETECTED NOT DETECTED Final   Parainfluenza Virus 2 NOT DETECTED NOT DETECTED Final   Parainfluenza Virus 3 NOT DETECTED NOT DETECTED Final   Parainfluenza Virus 4 NOT DETECTED NOT DETECTED Final   Respiratory Syncytial Virus NOT DETECTED NOT DETECTED Final   Bordetella pertussis NOT DETECTED NOT DETECTED Final   Chlamydophila pneumoniae NOT DETECTED NOT DETECTED Final   Mycoplasma pneumoniae NOT DETECTED NOT DETECTED Final    Comment: Performed at [facility], [address], [place], [place] [address]  Urine culture     Status: None   Collection Time: 03/15/18  1:27 PM  Result Value Ref Range Status   Specimen Description   Final    URINE, CLEAN CATCH Performed at [facility], [address] [place], [place] [address]    Special Requests   Final    NONE Performed at [facility], [address] [place], [place] [address]    Culture   Final    NO GROWTH Performed at [facility], [address] [place], [place] [address]    Report Status 03/16/2018 FINAL  Final  Culture, expectorated sputum-assessment     Status: None   Collection Time: 03/16/18 11:33 AM  Result Value Ref Range Status   Specimen Description SPUTUM  Final   Special Requests NONE  Final   Sputum evaluation   Final    THIS SPECIMEN IS ACCEPTABLE FOR SPUTUM CULTURE Performed at [facility], [address], [place], [place] [address]    Report Status 03/16/2018 FINAL  Final  Culture, respiratory (NON-Expectorated)     Status:  None (Preliminary result)   Collection Time: 03/16/18 11:33 AM  Result Value Ref Range Status   Specimen Description   Final    SPUTUM Performed at [facility], [address] [place], [place] [address]    Special Requests   Final    NONE Reflexed from [id number] Performed at [facility], [address] [place], [place] [address]    Gram Stain   Final    FEW SQUAMOUS EPITHELIAL CELLS PRESENT FEW WBC PRESENT,BOTH PMN AND MONONUCLEAR MODERATE GRAM POSITIVE RODS RARE YEAST FEW GRAM POSITIVE COCCI FEW GRAM NEGATIVE RODS    Culture   Final    CULTURE REINCUBATED FOR BETTER GROWTH Performed at [facility], [address], [place], [place] [address]    Report Status PENDING  Incomplete    Please note: You were cared for by a hospitalist during your hospital stay. Once you are discharged, your primary care physician will handle any further medical issues. Please note that NO REFILLS for any discharge medications will be authorized once you are  discharged, as it is imperative that you return to your primary care physician (or establish a relationship with a primary care physician if you do not have one) for your post hospital discharge needs so that they can reassess your need for medications and monitor your lab values.    Time coordinating discharge: 40 minutes  SIGNED:   Burnadette Pop, MD  [facility] 03/17/2018, 12:25 PM Pager [phone]  If 7PM-7AM, please contact night-coverage [email or web address] Password [email or web address]

## 2018-03-17 NOTE — Progress Notes (Signed)
[facility] pharmacy for cost of bactrim $4-$10(patient can afford) total cost for both meds $20(patient can afford)aware to go directly to pharmacy after d/c.

## 2018-03-17 NOTE — Progress Notes (Signed)
D/c instructions reviewed w/ pt and husband. Both verbalize understanding and all questions answered. Pt ambulated off unit w/ husband in stable condition. Pt in possession of d/c packet, scripts, bus pass, and all personal belongings.

## 2018-03-17 NOTE — Care Management Note (Signed)
Case Management Note  Patient Details  Name: Brett Carter MRN: [id number] Date of Birth: October 14, 1991  Subjective/Objective:Spoke to patient in rm about importance to call [facility] now to make a hospital f/u appt-since he can get his med augmentin $10(patient can afford). No further CM needs.                    Action/Plan:d/c home.   Expected Discharge Date:                  Expected Discharge Plan:  Home/Self Care  In-House Referral:     Discharge planning Services  CM Consult, Indigent Health Clinic, Medication Assistance  Post Acute Care Choice:    Choice offered to:     DME Arranged:    DME Agency:     HH Arranged:    HH Agency:     Status of Service:  Completed, signed off  If discussed at [employer] of Stay Meetings, dates discussed:    Additional Comments:  Lanier Clam, RN 03/17/2018, 10:29 AM

## 2018-03-17 NOTE — Discharge Instructions (Signed)
How to Care for Yourself When You Have HIV Taking good care of yourself is very important when you are living with HIV (human immunodeficiency virus). There are many things that you can do to stay as healthy as possible and prevent HIV from progressing to AIDS (acquired immunodeficiency syndrome). Follow these instructions at home: Medicines, herbs, and supplements  Make sure you understand your medicine plan.  Take over-the-counter and prescription medicines only as told by your health care provider. Take your medicines the right way, every day, regardless of how long you have had the virus.  Do not take any medicines, herbal remedies, or dietary supplements without your health care providers knowledge and approval. Diet and exercise  Eat a healthy diet that includes fruits and vegetables, whole grains, and lean proteins.  If you lose weight without trying, ask your health care provider whether you should take a protein or calorie supplement daily.  Drink enough fluid to keep your urine pale yellow.  Take steps to lower your risk of getting an illness that spreads through germs in food (foodborne illness): ? Do not eat or drink unpasteurized dairy products. ? Do not drink fruit juices. ? Do not eat raw seafood. ? Always cook eggs thoroughly. ? Make sure your meat is cooked until you cannot see any pink. ? Do not drink water that may contain human or animal waste, such as from a lake or river. Do not swallow water while swimming.  Follow your health care providers recommendations for participating in healthy exercise. Sexual activity  Avoid risky sexual behaviors and limit the number of sexual partners you have.  Use condoms correctly every time you engage in sexual activity that may expose you or your partner to bodily fluids.  Talk to your health care provider about other ways to protect your sexual partners. Health care visits  Get all recommended lab tests.  Keep your  vaccinations up to date. Get the flu vaccine every year.  If you wish to become pregnant, talk with your health care provider.  Ask to be checked for STIs (sexually transmitted infections).  Keep all follow-up visits as told by your health care provider. This is important. General instructions  Understand what germs you may be exposed to at work, at home, and in the community. Try to limit your exposure to these germs.  If you have a pet, such as a cat or bird, ask a friend or family member to change your cats litter box or clean your birdcage. Waste from certain pets may be potentially harmful to you.  Try to avoid contact with people who may have an illness that spreads easily from person to person (is contagious).  Do not share drug injection equipment.  Consider joining a support group for people living with HIV. Contact a health care provider if:  You develop a cough.  You are short of breath.  You have a fever.  You have diarrhea.  You have nausea or vomiting.  You are bruising or bleeding easily.  You have a new skin growth or an existing mole that changes in size, color, or shape.  You find out that you are pregnant.  You have a headache that does not get better with medicine.  You are losing weight without trying.  You feel weak.  You have flu-like symptoms, such as: ? Achiness. ? Weakness. ? Increased tiredness (fatigue). Get help right away if:  There is blood in your stool or vomit.  You cough up  blood.  You have chest pain.  You have abdominal pain.  You have shaking chills.  You have a severe headache.  You have trouble thinking clearly or you feel confused.  You have jerking movements or stiffening of your arms and legs that you cannot control (seizure).  You pass out. Summary  Take over-the-counter and prescription medicines only as told by your health care provider. Take your medicines the right way, every day, regardless of how  long you have had the virus.  Visit your health care provider regularly.  It is important to make choices that keep you healthy and protect others. This information is not intended to replace advice given to you by your health care provider. Make sure you discuss any questions you have with your health care provider. Document Released: 01/17/2017 Document Revised: 01/17/2017 Document Reviewed: 01/17/2017 Elsevier Interactive Patient Education  2018 [employer].   HIV Infection and AIDS HIV (human immunodeficiency virus) infection is a permanent (chronic) viral infection. HIV kills white blood cells that are called CD4 cells. These cells help to control the body's defense system (immune system) and fight infection. If a person does not have enough CD4 cells, he or she can develop infections, cancers, and other health problems. What are the causes? This condition is caused by HIV. This virus is passed from one person to another person:  Through sex.  Through contact with infected blood.  During childbirth or breastfeeding.  What increases the risk? This condition is more likely to develop in people who:  Have unprotected sex.  Share needles or other drug equipment.  What are the signs or symptoms? Symptoms of this condition usually develop in phases: Asymptomatic phase You may not feel sick, or you may only feel sick some of the time. Many people in this phase do not know that they have HIV. Symptoms may include:  Low-grade fever.  Rash.  Fatigue.  Sore throat.  Headaches.  Nausea, vomiting, or diarrhea.  Night sweats.  Early symptomatic phase You may notice:  Your early symptoms getting worse or happening more often.  Oral, vaginal, or rectal sores that are caused by infections.  Problems that are related to inflammation, such as joint pain.  Symptomatic phase (AIDS, or acquired immunodeficiency syndrome) Your immune system no longer protects you from  infections and other health problems. You may get infections that you would not normally get if your immune system was healthy and working properly (opportunistic diseases). Problems that are caused by opportunistic diseases include:  Coughing.  Trouble breathing.  Diarrhea.  Skin sores.  Trouble swallowing.  High fevers.  Blurred vision.  Stiff neck.  Mental confusion.  You may also begin to notice:  Weight loss.  Tingling or pain in your hands and feet.  Mouth sores or tooth pain.  Severe fatigue.  How is this diagnosed? This condition is diagnosed with:  A screening test to check blood for a chemical (antibody) that is produced only when the body is fighting HIV.  A blood test to confirm the presence of HIV.  How is this treated? There is no cure for this condition, but treatment can help to keep HIV from getting worse.  You will be given medicines that may slow down the rate at which HIV multiplies in your body (antiretroviral therapy, or ART). ART may: ? Keep your immune system as healthy as possible and help it work better. ? Decrease the amount of HIV in your body. ? Reduce the risk of  problems caused by HIV. ? Prolong your life. ? Improve the quality of your life. ? Help prevent passing HIV to someone.  You will need to have routine lab tests performed to monitor your treatment and immune system.  Follow these instructions at home: Medicines  Take over-the-counter and prescription medicines only as told by your health care provider.  If you were prescribed an antibiotic medicine, take it as told by your health care provider. Do not stop taking the antibiotic even if you start to feel better. Lifestyle  Stop or decrease your use of alcohol and recreational drugs, which can cause further damage to your immune system. They can also cause problems with your liver, lungs, and heart.  Do not use any products that contain nicotine or tobacco, such as  cigarettes and e-cigarettes. If you need help quitting, ask your health care provider.  Do not share needles or other equipment that is used for injecting, smoking, or snorting drugs.  Protect yourself from other STIs (sexually transmitted infections) by using condoms when you have sex. This includes vaginal, oral, and anal sex.  Eat in a healthy way, get enough sleep, and exercise. General instructions  Tell your sexual partners that you have HIV. Encourage them to get tested.  Keep your vaccinations up to date. Make sure that you get all recommended vaccines, including vaccines for hepatitis A, hepatitis B, measles, and influenza.  See your dentist regularly. Brush and floss your teeth every day.  See a counselor or a Child psychotherapist to help you solve problems and find any services that you need.  Get support from your family and friends.  Keep all follow-up visits as told by your health care provider. This is important. You will need to have routine blood tests every 3-6 months to monitor your health and to make sure your treatment is working. How is this prevented? To prevent the spread of HIV:  Talk with your health care provider about protecting your sexual partners from HIV. Your health care provider may encourage your partner to take medicines to decrease the risk of getting HIV (pre-exposure prophylaxis, or PrEP).  Use a condom every time you have sex. This includes vaginal, oral, and anal sex. ? The condom should be in place from the beginning of the sexual activity to the end. ? Use only latex or polyurethane condoms and water-based lubricants. ? Wearing a condom reduces, but does not completely eliminate, your risk of spreading HIV. ? Condoms also protect you from other STIs.  Avoid alcohol and recreational drugs that affect your judgment. They may make you forget to use a condom or may increase your chances of participating in high-risk sex.  Do not share equipment that is  used to take drugs, such as needles, syringes, cookers, tourniquets, pipes, or straws. If you share equipment, clean it before and after you use it.  Contact a health care provider if:  You lose a lot of weight.  You have extreme fatigue.  You have trouble swallowing.  You have vomiting or diarrhea that does not get better.  You have muscle pain or joint pain.  You have any problems that are related to your medicines. Get help right away if:  You have a rash that causes your skin to peel.  You develop blisters inside your mouth.  You have pain in your abdomen.  You have swelling around your eyes, or you have eye redness.  You have a high fever and chills.  You have  shortness of breath.  You have a cough that is dry (nonproductive) or wet (productive).  You have vision problems, such as blind spots, flashing lights, or decreased or blurred vision.  You have a persistent headache, confusion, or changes in the way that you think, feel, or behave (altered mental status). This information is not intended to replace advice given to you by your health care provider. Make sure you discuss any questions you have with your health care provider. Document Released: 07/22/2014 Document Revised: 04/26/2016 Document Reviewed: 03/25/2016 Elsevier Interactive Patient Education  2018 [employer].

## 2018-03-18 LAB — CULTURE, RESPIRATORY W GRAM STAIN: Culture: NORMAL

## 2018-03-18 LAB — CULTURE, RESPIRATORY

## 2018-03-20 LAB — CULTURE, BLOOD (ROUTINE X 2)
CULTURE: NO GROWTH
Culture: NO GROWTH

## 2018-03-24 ENCOUNTER — Encounter: Payer: Self-pay | Admitting: Infectious Diseases

## 2018-03-24 ENCOUNTER — Ambulatory Visit (INDEPENDENT_AMBULATORY_CARE_PROVIDER_SITE_OTHER): Payer: Self-pay | Admitting: Infectious Diseases

## 2018-03-24 VITALS — BP 123/69 | HR 84 | Temp 97.9°F | Resp 16 | Ht 68.0 in | Wt 149.0 lb

## 2018-03-24 DIAGNOSIS — B20 Human immunodeficiency virus [HIV] disease: Secondary | ICD-10-CM

## 2018-03-24 DIAGNOSIS — J189 Pneumonia, unspecified organism: Secondary | ICD-10-CM

## 2018-03-24 DIAGNOSIS — Z0000 Encounter for general adult medical examination without abnormal findings: Secondary | ICD-10-CM

## 2018-03-24 DIAGNOSIS — J181 Lobar pneumonia, unspecified organism: Secondary | ICD-10-CM

## 2018-03-24 LAB — HLA B*5701: HLA B 5701: NEGATIVE

## 2018-03-24 MED ORDER — BICTEGRAVIR-EMTRICITAB-TENOFOV 50-200-25 MG PO TABS: 1.0000 | ORAL_TABLET | Freq: Every day | ORAL | 5 refills | Status: DC

## 2018-03-24 MED ORDER — SULFAMETHOXAZOLE-TRIMETHOPRIM 800-160 MG PO TABS: 1.0000 | ORAL_TABLET | Freq: Every day | ORAL | 4 refills | Status: DC

## 2018-03-24 NOTE — Patient Instructions (Addendum)
Wonderful to meet you!   Would like for you to continue taking your Biktarvy and Bactrim once a day.   For diarrhea you can take over the counter imodium - this should ease up over time.   Please return to see Judeth CornfieldStephanie in 4 weeks to recheck your blood work and talk again about how you are doing.

## 2018-03-24 NOTE — Progress Notes (Signed)
Name: Brett Carter  DOB: [birth year] MRN: [id number] PCP: Patient, No Pcp Per   Chief Complaint  Patient presents with  . Hospitalization Follow-up    pneumonia and new HIV      Patient Active Problem List   Diagnosis Date Noted  . Health care maintenance 03/30/2018  . AIDS (acquired immune deficiency syndrome) (HCC) 03/15/2018     Brief Narrative:  Brett Carter is a 27 y.o. AA male with HIV infection. Originally diagnosed with HIV during hospitalization recently. CD4 nadir 90. His ex-fiance is HIV+ and called to inform him of this a few months ago but Mr. Purnell thought this was just him being 'attention seeking." History of OIs: pneumonia, oral thrush. HIV Risk: MSM   Previous Regimens: . Biktarvy   Genotypes: . In process from 03/16/18   Subjective:  Brett Carter is here today for hospital follow up for pneumonia and to establish care for HIV. He was recently diagnosed during his recent hospital stay. He is currently married to a male whom is HIV negative. He is feeling much better regarding his breathing, cough and energy. No further fevers and has a better appetite. He has finished his antibiotics and now only on the biktarvy and prophylactic bactrim.   He has never been tested for HIV in the past as he has always been "very careful" with protected sex. He was very surprised and concerned he was positive. He was linked to care during hospital stay and introduced to our bridge counselor Mitch. Other than the acute pneumonia he struggled with painful swallowing and found to have thrush during hospital stay. This has since resolved after taking his medications. He has missed no doses of his biktarvy since discharge. Interested in knowing about prognosis. 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   Review of Systems  Constitutional: Negative for chills and fever.  HENT: Negative for tinnitus.   Eyes: Negative for blurred vision and photophobia.  Respiratory: Negative for cough and sputum  production.   Cardiovascular: Negative for chest pain.  Gastrointestinal: Negative for diarrhea, nausea and vomiting.  Genitourinary: Negative for dysuria.  Skin: Negative for rash.  Neurological: Negative for headaches.    Past Medical History:  Diagnosis Date  . ADHD   . HIV infection [facility])     Outpatient Medications Prior to Visit  Medication Sig Dispense Refill  . bictegravir-emtricitabine-tenofovir AF (BIKTARVY) 50-200-25 MG TABS tablet Take 1 tablet by mouth daily. 30 tablet 0  . sulfamethoxazole-trimethoprim (BACTRIM DS,SEPTRA DS) 800-160 MG tablet Take 1 tablet by mouth daily. 30 tablet 0   No facility-administered medications prior to visit.      Allergies  Allergen Reactions  . Shellfish-Derived Products Anaphylaxis  . Depakote [Valproic Acid]   . Lexapro [Escitalopram Oxalate]   . Metadate Cd [Methylphenidate]   . Zyprexa [Olanzapine]     Social History   Tobacco Use  . Smoking status: Current Every Day Smoker    Packs/day: 0.10    Types: Cigarettes  . Smokeless tobacco: Never Used  Substance Use Topics  . Alcohol use: Yes  . Drug use: No    Family History  Problem Relation Age of Onset  . Diabetes Other   . Hypertension Other   . Healthy Mother     Social History   Substance and Sexual Activity  Sexual Activity Not Currently  . Birth control/protection: Condom     Objective:   Vitals:   03/24/18 1508  BP: 123/69  Pulse: 84  Resp: 16  Temp: 97.9 F (36.6 C)  TempSrc: Oral  SpO2: 99%  Weight: 149 lb (67.6 kg)  Height: 5\' 8"  (1.727 m)   Body mass index is 22.66 kg/m.  Physical Exam  Constitutional: He is oriented to person, place, and time.  Seated comfortably in chair.   HENT:  Mouth/Throat: No oral lesions. Normal dentition. No dental caries.  Eyes: No scleral icterus.  Cardiovascular: Normal rate, regular rhythm and normal heart sounds.  Pulmonary/Chest: Effort normal and breath sounds normal.  Abdominal: Soft. He exhibits  no distension. There is no tenderness.  Lymphadenopathy:    He has no cervical adenopathy.  Neurological: He is alert and oriented to person, place, and time.  Skin: Skin is warm and dry. No rash noted.    Lab Results Lab Results  Component Value Date   WBC 5.4 03/16/2018   HGB 10.4 (L) 03/16/2018   HCT 31.6 (L) 03/16/2018   MCV 85.6 03/16/2018   PLT 210 03/16/2018    Lab Results  Component Value Date   CREATININE 0.99 03/16/2018   BUN 7 03/16/2018   NA 142 03/16/2018   K 3.7 03/16/2018   CL 109 03/16/2018   CO2 25 03/16/2018    Lab Results  Component Value Date   ALT 22 03/15/2018   AST 35 03/15/2018   ALKPHOS 47 03/15/2018   BILITOT 0.8 03/15/2018    No results found for: CHOL, HDL, LDLCALC, LDLDIRECT, TRIG, CHOLHDL HIV 1 RNA Quant (copies/mL)  Date Value  03/15/2018 104,000     Assessment & Plan:   Problem List Items Addressed This Visit      Respiratory   RESOLVED: Community acquired pneumonia of left lower lobe of lung (HCC)    Improved clinically. Normal lung exam today. Will consider need to repeat CXR for resolution of infiltrate barring his symptoms continue to improve.       Relevant Medications   bictegravir-emtricitabine-tenofovir AF (BIKTARVY) 50-200-25 MG TABS tablet   sulfamethoxazole-trimethoprim (BACTRIM DS,SEPTRA DS) 800-160 MG tablet     Other   Health care maintenance    Will vaccinate with recommended vaccines once CD4 > 200.  Would recommend Prevnar in 02/2019, Hep A, Meningococcal, HPV       AIDS (acquired immune deficiency syndrome) (HCC)    New patient here to establish for HIV care. I discussed with Ammie Dalton treatment options/side effects, benefits of treatment and long-term outcomes. I discussed how HIV is transmitted and the process of untreated HIV including increased risk for opportunistic infections, cancer, dementia and renal failure. He was counseled on routine HIV care including medication adherence, blood monitoring,  necessary vaccines and follow up visits. Counseled regarding safe sex practices including: condom use, partner disclosure, limiting partners.  He spent time talking with our pharmacist team regarding successful practices of ART and understands to reach out to our clinic in the future with questions.   His emergency HMAP has been approved. All medications have been sent to appropriate pharmacy. He will continue Biktarvy and Bactrim for OI proph. I spent greater than 40 minutes with the patient today. Greater than 50% of the time spent face-to-face counseling and coordination of care re: HIV and health maintenance.        Relevant Medications   bictegravir-emtricitabine-tenofovir AF (BIKTARVY) 50-200-25 MG TABS tablet   sulfamethoxazole-trimethoprim (BACTRIM DS,SEPTRA DS) 800-160 MG tablet     Will return in 4 weeks to recheck VL and education.   Rexene Alberts, MSN, NP-C [facility] Pager: [phone] Office: [phone]  03/30/18  1:05 PM

## 2018-03-27 ENCOUNTER — Inpatient Hospital Stay: Payer: Self-pay

## 2018-03-30 DIAGNOSIS — Z0000 Encounter for general adult medical examination without abnormal findings: Secondary | ICD-10-CM | POA: Insufficient documentation

## 2018-03-30 NOTE — Assessment & Plan Note (Signed)
Will vaccinate with recommended vaccines once CD4 > 200.  Would recommend Prevnar in 02/2019, Hep A, Meningococcal, HPV

## 2018-03-30 NOTE — Assessment & Plan Note (Signed)
Improved clinically. Normal lung exam today. Will consider need to repeat CXR for resolution of infiltrate barring his symptoms continue to improve.

## 2018-03-30 NOTE — Assessment & Plan Note (Signed)
New patient here to establish for HIV care. I discussed with Brett DaltonPerry Savard treatment options/side effects, benefits of treatment and long-term outcomes. I discussed how HIV is transmitted and the process of untreated HIV including increased risk for opportunistic infections, cancer, dementia and renal failure. He was counseled on routine HIV care including medication adherence, blood monitoring, necessary vaccines and follow up visits. Counseled regarding safe sex practices including: condom use, partner disclosure, limiting partners.  He spent time talking with our pharmacist team regarding successful practices of ART and understands to reach out to our clinic in the future with questions.   His emergency HMAP has been approved. All medications have been sent to appropriate pharmacy. He will continue Biktarvy and Bactrim for OI proph. I spent greater than 40 minutes with the patient today. Greater than 50% of the time spent face-to-face counseling and coordination of care re: HIV and health maintenance.

## 2018-04-08 ENCOUNTER — Other Ambulatory Visit: Payer: Self-pay | Admitting: Behavioral Health

## 2018-04-08 ENCOUNTER — Telehealth: Payer: Self-pay | Admitting: *Deleted

## 2018-04-08 DIAGNOSIS — B20 Human immunodeficiency virus [HIV] disease: Secondary | ICD-10-CM

## 2018-04-08 DIAGNOSIS — Z202 Contact with and (suspected) exposure to infections with a predominantly sexual mode of transmission: Secondary | ICD-10-CM

## 2018-04-08 NOTE — Telephone Encounter (Signed)
Per verbal from Dr Luciana Axeomer patient will be in office tomorrow to have swabs as his partner was seen today and found to have Chlamydia. Got a verbal order from Dr Luciana Axeomer to treat patient with 1 G azithromycin and do swabs for both oral and rectal. Patient scheduled for visit tomorrow 04/09/18.

## 2018-04-09 ENCOUNTER — Ambulatory Visit (INDEPENDENT_AMBULATORY_CARE_PROVIDER_SITE_OTHER): Payer: Self-pay | Admitting: *Deleted

## 2018-04-09 DIAGNOSIS — Z202 Contact with and (suspected) exposure to infections with a predominantly sexual mode of transmission: Secondary | ICD-10-CM

## 2018-04-09 DIAGNOSIS — B20 Human immunodeficiency virus [HIV] disease: Secondary | ICD-10-CM

## 2018-04-09 DIAGNOSIS — Z113 Encounter for screening for infections with a predominantly sexual mode of transmission: Secondary | ICD-10-CM

## 2018-04-09 MED ORDER — AZITHROMYCIN 250 MG PO TABS
1000.0000 mg | ORAL_TABLET | Freq: Once | ORAL | Status: AC
  Administered 2018-04-09: 1000 mg via ORAL

## 2018-04-10 LAB — CYTOLOGY, (ORAL, ANAL, URETHRAL) ANCILLARY ONLY
CHLAMYDIA, DNA PROBE: NEGATIVE
NEISSERIA GONORRHEA: NEGATIVE

## 2018-04-10 LAB — URINE CYTOLOGY ANCILLARY ONLY
CHLAMYDIA, DNA PROBE: NEGATIVE
NEISSERIA GONORRHEA: NEGATIVE

## 2018-04-13 LAB — HIV GENOSURE(R) MG

## 2018-04-13 LAB — HIV-1 RNA, PCR (GRAPH) RFX/GENO EDI
HIV-1 RNA BY PCR: 71000 {copies}/mL
HIV-1 RNA QUANT, LOG: 4.851 {Log_copies}/mL

## 2018-04-13 LAB — REFLEX TO GENOSURE(R) MG EDI

## 2018-04-21 ENCOUNTER — Ambulatory Visit: Payer: Self-pay

## 2018-04-21 ENCOUNTER — Encounter: Payer: Self-pay | Admitting: Infectious Diseases

## 2018-04-21 ENCOUNTER — Ambulatory Visit (INDEPENDENT_AMBULATORY_CARE_PROVIDER_SITE_OTHER): Payer: Self-pay | Admitting: Infectious Diseases

## 2018-04-21 VITALS — BP 117/73 | HR 75 | Temp 98.8°F | Wt 150.0 lb

## 2018-04-21 DIAGNOSIS — Z0000 Encounter for general adult medical examination without abnormal findings: Secondary | ICD-10-CM

## 2018-04-21 DIAGNOSIS — M545 Low back pain, unspecified: Secondary | ICD-10-CM | POA: Insufficient documentation

## 2018-04-21 DIAGNOSIS — R194 Change in bowel habit: Secondary | ICD-10-CM | POA: Insufficient documentation

## 2018-04-21 DIAGNOSIS — B20 Human immunodeficiency virus [HIV] disease: Secondary | ICD-10-CM

## 2018-04-21 NOTE — Assessment & Plan Note (Signed)
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 Encouraged core exercises and good foot wear. Recommended 3 days of Aleve 500 mg BID then to use just Tylenol for pain. Heat to low back for relief and stretching. Salonas or IcyHot patches OTC.

## 2018-04-21 NOTE — Assessment & Plan Note (Signed)
Will vaccinate once CD4 reconstitutes > 200.

## 2018-04-21 NOTE — Assessment & Plan Note (Addendum)
Continue Biktarvy and Bactrim once daily. It sounds like he is tolerating medications w/o adverse effect and is doing great with adherence. Labs today.

## 2018-04-21 NOTE — Patient Instructions (Addendum)
Continue your Bactrim and Biktarvy once a day - you are doing a great job.   Salonpas or IcyHot topical patches for back pain relief.  Can also take Tylenol 2 extra strength tablets 3-4 times a day.  OK to use Aleve twice a day for 3 days.   For your bowel movements - as long as you are not having stomach pain or rectal pain, it is not watery and there is no blood then it sounds like everything is OK.   Make sure you cook your foods through thoroughly (red meat, pork and chicken for sure) and wash all produce to make sure you do not get sick.

## 2018-04-21 NOTE — Progress Notes (Signed)
Name: Brett Carter  DOB: [birth year] MRN: [id number] PCP: Patient, No Pcp Per   Patient Active Problem List   Diagnosis Date Noted  . Low back pain 04/21/2018  . Frequent bowel movements 04/21/2018  . Health care maintenance 03/30/2018  . AIDS (acquired immune deficiency syndrome) (HCC) 03/15/2018     Brief Narrative:  Brett Carter is a 27 y.o. AA male with HIV infection. Originally diagnosed with HIV during hospitalization recently. CD4 nadir 90. His ex-fiance is HIV+ and called to inform him of this a few months ago but Brett Carter thought this was just him being 'attention seeking." History of OIs: pneumonia, oral thrush. HIV Risk: MSM   Previous Regimens: . Biktarvy 2019  Genotypes: . 03/16/18 - no significant mutations; pansensitive   Subjective:   Chief Complaint  Patient presents with  . Follow-up    HPI/ROS:  Brett Carter is here today routine follow up on his HIV/AIDS disease. He is taking his Biktarvy and Bactrim every day at 2 pm. He has only had one day where he delayed until 3 pm but no missed doses. He and his husband (whom is also positive) hold each other accountable for taking medications accurately. No side effects to either medication from what he can tell. Reports no complaints today suggestive of associated opportunistic infection or advancing HIV disease such as fevers, night sweats, weight loss, anorexia, cough, SOB, nausea, vomiting, diarrhea, headache, sensory changes, lymphadenopathy or oral thrush.   Only concerns today include lower back pain and Increased stool frequency lately. He is passing formed BMs 3-4 times a day. No abdominal or rectal pain/cramping, no blood, no fevers/chills or diarrhea/loose stools. No changes in food but feels he is eating more quantity at meals. 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 Back pain has been ongoing for 7 days now. No trauma or injury to explain. No radiating pains. Only describes it to  be a dull ache in low back. Has not tried anything for the pain.   Review of Systems  Constitutional: Negative for chills and fever.  HENT: Negative for tinnitus.   Eyes: Negative for blurred vision and photophobia.  Respiratory: Negative for cough, sputum production and shortness of breath.   Cardiovascular: Negative for chest pain.  Gastrointestinal: Negative for diarrhea, nausea and vomiting.  Genitourinary: Negative for dysuria.  Musculoskeletal: Positive for back pain.  Skin: Negative for rash.  Neurological: Negative for dizziness and headaches.    Past Medical History:  Diagnosis Date  . ADHD   . HIV infection [facility])     Outpatient Medications Prior to Visit  Medication Sig Dispense Refill  . bictegravir-emtricitabine-tenofovir AF (BIKTARVY) 50-200-25 MG TABS tablet Take 1 tablet by mouth daily. 30 tablet 5  . sulfamethoxazole-trimethoprim (BACTRIM DS,SEPTRA DS) 800-160 MG tablet Take 1 tablet by mouth daily. 30 tablet 4   No facility-administered medications prior to visit.      Allergies  Allergen Reactions  . Shellfish-Derived Products Anaphylaxis  . Depakote [Valproic Acid]   . Lexapro [Escitalopram Oxalate]   . Metadate Cd [Methylphenidate]   . Zyprexa [Olanzapine]    Social History   Tobacco Use  . Smoking status: Current Every Day Smoker    Packs/day: 0.10    Types: Cigarettes  . Smokeless tobacco: Never Used  Substance Use Topics  . Alcohol use: Yes  . Drug use: No    Social History   Substance and Sexual Activity  Sexual Activity Not Currently  . Birth  control/protection: Condom     Objective:   Vitals:   04/21/18 0944  BP: 117/73  Pulse: 75  Temp: 98.8 F (37.1 C)  TempSrc: Oral  Weight: 150 lb (68 kg)   Body mass index is 22.81 kg/m.  Physical Exam  Constitutional: He is oriented to person, place, and time.  Seated comfortably in chair. Nicely dressed and appears well today.   HENT:  Mouth/Throat: Oropharynx is clear and  moist. No oral lesions. Normal dentition. No dental caries.  Eyes: Pupils are equal, round, and reactive to light. No scleral icterus.  Cardiovascular: Normal rate, regular rhythm and normal heart sounds.  No murmur heard. Pulmonary/Chest: Effort normal and breath sounds normal. No respiratory distress. He has no wheezes. He has no rales.  Abdominal: Soft. Bowel sounds are normal. He exhibits no distension and no mass. There is no tenderness.  Musculoskeletal:       Lumbar back: He exhibits tenderness (across low back over musculature. No bony tenderness. ). He exhibits normal range of motion and no spasm.  Lymphadenopathy:    He has no cervical adenopathy.  Neurological: He is alert and oriented to person, place, and time.  Skin: Skin is warm and dry. No rash noted.    Lab Results Lab Results  Component Value Date   WBC 5.4 03/16/2018   HGB 10.4 (L) 03/16/2018   HCT 31.6 (L) 03/16/2018   MCV 85.6 03/16/2018   PLT 210 03/16/2018    Lab Results  Component Value Date   CREATININE 0.99 03/16/2018   BUN 7 03/16/2018   NA 142 03/16/2018   K 3.7 03/16/2018   CL 109 03/16/2018   CO2 25 03/16/2018    Lab Results  Component Value Date   ALT 22 03/15/2018   AST 35 03/15/2018   ALKPHOS 47 03/15/2018   BILITOT 0.8 03/15/2018    No results found for: CHOL, HDL, LDLCALC, LDLDIRECT, TRIG, CHOLHDL HIV 1 RNA Quant (copies/mL)  Date Value  03/15/2018 104,000    Assessment & Plan:   Problem List Items Addressed This Visit      Other   Low back pain    Likely d/t being up and about on feet serving for long hours. Encouraged core exercises and good foot wear. Recommended 3 days of Aleve 500 mg BID then to use just Tylenol for pain. Heat to low back for relief and stretching. Salonas or IcyHot patches OTC.       Health care maintenance    Will vaccinate once CD4 reconstitutes > 200.      Frequent bowel movements    Nothing concerning on history today that would warrant further  investigation. Reassurance and call back precautions provided.       AIDS (acquired immune deficiency syndrome) (HCC) - Primary    Continue Biktarvy and Bactrim once daily. It sounds like he is tolerating medications w/o adverse effect and is doing great with adherence. Labs today.       Relevant Orders   HIV 1 RNA quant-no reflex-bld   T-helper cell (CD4)- ([facility] clinic only)     Return in 2 months.   Rexene AlbertsStephanie Dixon, MSN, NP-C [facility] Pager: [phone] Office: [phone]  04/22/18  10:47 AM

## 2018-04-21 NOTE — Assessment & Plan Note (Signed)
Nothing concerning on history today that would warrant further investigation. Reassurance and call back precautions provided.

## 2018-04-22 LAB — T-HELPER CELL (CD4) - (RCID CLINIC ONLY)
CD4 T CELL HELPER: 14 % — AB (ref 33–55)
CD4 T Cell Abs: 200 /uL — ABNORMAL LOW (ref 400–2700)

## 2018-04-22 NOTE — Progress Notes (Signed)
CD4 up nicely to 200 - continue bactrim x 3 months.

## 2018-04-24 ENCOUNTER — Telehealth: Payer: Self-pay | Admitting: Behavioral Health

## 2018-04-24 LAB — HIV-1 RNA QUANT-NO REFLEX-BLD
HIV 1 RNA QUANT: 52 {copies}/mL — AB
HIV-1 RNA QUANT, LOG: 1.72 {Log_copies}/mL — AB

## 2018-04-24 NOTE — Telephone Encounter (Signed)
Thank you greatly for letting him know.

## 2018-04-24 NOTE — Telephone Encounter (Signed)
-----   Message from Blanchard KelchStephanie N Dixon, NP sent at 04/24/2018 11:14 AM EDT ----- Please call Brett Carter and let him know that his viral load is down to only 52 copies. < 20 is undetectable and he was at 104,000 only 1 month ago. He is doing very well on his medications and they are working well for him.

## 2018-04-24 NOTE — Telephone Encounter (Signed)
Called Brett DaltonPerry Hadsall per Rexene AlbertsStephanie Dixon NP.  Verified identity, informed per Rexene AlbertsStephanie Dixon NP that his viral load is now down to 52 copies from 104,000 copies.  Informed him that undetectable is <20 copies.  Let him know he is doing well on his medications and his medications are working.  Congratulated him on this.  Patient appreciative and verbalized understanding. Angeline SlimAshley Armari Fussell RN

## 2018-05-14 ENCOUNTER — Other Ambulatory Visit: Payer: Self-pay | Admitting: Infectious Disease

## 2018-05-14 DIAGNOSIS — B20 Human immunodeficiency virus [HIV] disease: Secondary | ICD-10-CM

## 2018-05-25 ENCOUNTER — Encounter: Payer: Self-pay | Admitting: Infectious Diseases

## 2018-06-16 ENCOUNTER — Ambulatory Visit: Payer: Self-pay | Admitting: Infectious Diseases

## 2018-07-08 ENCOUNTER — Ambulatory Visit (INDEPENDENT_AMBULATORY_CARE_PROVIDER_SITE_OTHER): Payer: Self-pay | Admitting: Infectious Diseases

## 2018-07-08 ENCOUNTER — Encounter: Payer: Self-pay | Admitting: Infectious Diseases

## 2018-07-08 ENCOUNTER — Other Ambulatory Visit: Payer: Self-pay

## 2018-07-08 VITALS — BP 103/65 | HR 80 | Temp 97.9°F | Ht 68.0 in | Wt 160.0 lb

## 2018-07-08 DIAGNOSIS — R194 Change in bowel habit: Secondary | ICD-10-CM

## 2018-07-08 DIAGNOSIS — Z23 Encounter for immunization: Secondary | ICD-10-CM

## 2018-07-08 DIAGNOSIS — Z0000 Encounter for general adult medical examination without abnormal findings: Secondary | ICD-10-CM

## 2018-07-08 DIAGNOSIS — Z21 Asymptomatic human immunodeficiency virus [HIV] infection status: Secondary | ICD-10-CM

## 2018-07-08 NOTE — Assessment & Plan Note (Signed)
Flu shot today. Due for Prevnar 02-2019. Meningococcal at next appt.

## 2018-07-08 NOTE — Patient Instructions (Signed)
Continue your Biktarvy once a day every day as you are taking now. Your blood work looks Music therapistgreat and your medicatoins are working very well.   Please take your bactrim (antibiotic big white pill) every day until October 1st then you can stop that one.   Great job on no more smoking! Proud of you!   Please come back in 2 months with labs prior to your visit. Will give you a vaccine at this visit too

## 2018-07-08 NOTE — Progress Notes (Signed)
Name: Brett Carter  DOB: 1991-07-06 MRN: [id number] PCP: Patient, No Pcp Per   Patient Active Problem List   Diagnosis Date Noted  . Health care maintenance 03/30/2018  . HIV (human immunodeficiency virus infection) (HCC) 03/15/2018     Brief Narrative:  Brett Carter is a 27 y.o. AA male with HIV infection. Originally diagnosed with HIV during hospitalization recently. CD4 nadir 90. His ex-fiance is HIV+ and called to inform him of this a few months ago but Brett Carter thought this was just him being 'attention seeking." History of OIs: pneumonia, oral thrush. HIV Risk: MSM   Previous Regimens: . Biktarvy 2019  Genotypes: . 03/16/18 - no significant mutations; pansensitive   Subjective:   Chief Complaint  Patient presents with  . Follow-up    HIV    HPI/ROS:  Brett Carter is here today routine follow up on his HIV/AIDS disease. He is taking his Biktarvy and Bactrim every day at 2 pm still and has not missed any doses since last visit. His husband also takes the same medicine so they hold each other accountable. His diarrhea has resolved and he has gained back about 5# since being on medicines consistently. Reports no complaints today suggestive of associated opportunistic infection or advancing HIV disease such as fevers, night sweats, weight loss, anorexia, cough, SOB, nausea, vomiting, diarrhea, headache, sensory changes, lymphadenopathy or oral thrush.   He has not had flu shot yet and otherwise has no complaints or concerns about his health.   Review of Systems  Constitutional: Negative for chills and fever.  HENT: Negative for tinnitus.   Eyes: Negative for blurred vision and photophobia.  Respiratory: Negative for cough, sputum production and shortness of breath.   Cardiovascular: Negative for chest pain.  Gastrointestinal: Negative for diarrhea, nausea and vomiting.  Genitourinary: Negative for dysuria.  Musculoskeletal: Negative for back pain.  Skin: Negative for rash.    Neurological: Negative for dizziness and headaches.    Past Medical History:  Diagnosis Date  . ADHD   . HIV infection [facility](HCC)     Outpatient Medications Prior to Visit  Medication Sig Dispense Refill  . bictegravir-emtricitabine-tenofovir AF (BIKTARVY) 50-200-25 MG TABS tablet Take 1 tablet by mouth daily. 30 tablet 5  . sulfamethoxazole-trimethoprim (BACTRIM DS,SEPTRA DS) 800-160 MG tablet Take 1 tablet by mouth daily. 30 tablet 4  . sulfamethoxazole-trimethoprim (BACTRIM DS,SEPTRA DS) 800-160 MG tablet TAKE 1 TABLET BY MOUTH DAILY 30 tablet 5  . BIKTARVY 50-200-25 MG TABS tablet TAKE 1 TABLET BY MOUTH DAILY 30 tablet 3   No facility-administered medications prior to visit.      Allergies  Allergen Reactions  . Shellfish-Derived Products Anaphylaxis  . Depakote [Valproic Acid]   . Lexapro [Escitalopram Oxalate]   . Metadate Cd [Methylphenidate]   . Zyprexa [Olanzapine]    Social History   Tobacco Use  . Smoking status: Former Smoker    Packs/day: 0.10    Types: Cigarettes  . Smokeless tobacco: Never Used  . Tobacco comment: quit two weeks ago (06/2018)  Substance Use Topics  . Alcohol use: Yes  . Drug use: No    Social History   Substance and Sexual Activity  Sexual Activity Not Currently  . Birth control/protection: Condom     Objective:   Vitals:   07/08/18 1615  BP: 103/65  Pulse: 80  Temp: 97.9 F (36.6 C)  Weight: 160 lb (72.6 kg)  Height: 5\' 8"  (1.727 m)   Body mass index is 24.33 kg/m.  Physical  Exam  Constitutional: He is oriented to person, place, and time. He appears well-developed and well-nourished.  Seated comfortably in chair during visit. Well groomed and appropriately dressed.   HENT:  Mouth/Throat: Oropharynx is clear and moist and mucous membranes are normal. Normal dentition. No dental abscesses.  Dental extractions recently.   Cardiovascular: Normal rate, regular rhythm and normal heart sounds.  Pulmonary/Chest: Effort normal  and breath sounds normal.  Abdominal: Soft. He exhibits no distension. There is no tenderness.  Lymphadenopathy:    He has no cervical adenopathy.  Neurological: He is alert and oriented to person, place, and time.  Skin: Skin is warm and dry. No rash noted.  Psychiatric: He has a normal mood and affect. Judgment normal.  In good spirits today and engaged in care discussion.   Vitals reviewed.   Lab Results Lab Results  Component Value Date   WBC 5.4 03/16/2018   HGB 10.4 (L) 03/16/2018   HCT 31.6 (L) 03/16/2018   MCV 85.6 03/16/2018   PLT 210 03/16/2018    Lab Results  Component Value Date   CREATININE 0.99 03/16/2018   BUN 7 03/16/2018   NA 142 03/16/2018   K 3.7 03/16/2018   CL 109 03/16/2018   CO2 25 03/16/2018    Lab Results  Component Value Date   ALT 22 03/15/2018   AST 35 03/15/2018   ALKPHOS 47 03/15/2018   BILITOT 0.8 03/15/2018    No results found for: CHOL, HDL, LDLCALC, LDLDIRECT, TRIG, CHOLHDL HIV 1 RNA Quant (copies/mL)  Date Value  04/21/2018 52 (H)  03/15/2018 104,000   CD4 T Cell Abs (/uL)  Date Value  04/21/2018 200 (L)    Assessment & Plan:   Problem List Items Addressed This Visit      Other   HIV (human immunodeficiency virus infection) (HCC) - Primary (Chronic)    His viral load has suppressed nicely to 50 copies and his CD4 has increased to 200. Will have him continue Biktarvy daily. Bactrim daily x 1 more month (can stop on October 1). Will do labs at follow up visit and see him back in 2 months.       Relevant Orders   HIV-1 RNA quant-no reflex-bld   T-helper cell (CD4)- ([facility] only)   Health care maintenance    Flu shot today. Due for Prevnar 02-2019. Meningococcal at next appt.       RESOLVED: Frequent bowel movements    Resolved.         No orders of the defined types were placed in this encounter.  Return in about 2 months (around 09/07/2018).   Rexene Alberts, MSN, NP-C [facility] Pager: [phone] Office: [phone]  07/08/18  4:38 PM

## 2018-07-08 NOTE — Assessment & Plan Note (Signed)
Resolved

## 2018-07-08 NOTE — Assessment & Plan Note (Signed)
His viral load has suppressed nicely to 50 copies and his CD4 has increased to 200. Will have him continue Biktarvy daily. Bactrim daily x 1 more month (can stop on October 1). Will do labs at follow up visit and see him back in 2 months.

## 2018-08-24 ENCOUNTER — Other Ambulatory Visit: Payer: Self-pay

## 2018-08-24 DIAGNOSIS — Z21 Asymptomatic human immunodeficiency virus [HIV] infection status: Secondary | ICD-10-CM

## 2018-08-26 LAB — T-HELPER CELL (CD4) - (RCID CLINIC ONLY)
CD4 T CELL ABS: 170 /uL — AB (ref 400–2700)
CD4 T CELL HELPER: 12 % — AB (ref 33–55)

## 2018-08-26 LAB — HIV-1 RNA QUANT-NO REFLEX-BLD
HIV 1 RNA Quant: <20 copies/mL — AB
HIV-1 RNA QUANT, LOG: DETECTED {Log_copies}/mL — AB

## 2018-09-07 ENCOUNTER — Ambulatory Visit (INDEPENDENT_AMBULATORY_CARE_PROVIDER_SITE_OTHER): Payer: Self-pay | Admitting: Infectious Diseases

## 2018-09-07 ENCOUNTER — Encounter: Payer: Self-pay | Admitting: Infectious Diseases

## 2018-09-07 VITALS — BP 125/80 | HR 76 | Temp 98.8°F | Wt 168.0 lb

## 2018-09-07 DIAGNOSIS — B20 Human immunodeficiency virus [HIV] disease: Secondary | ICD-10-CM

## 2018-09-07 DIAGNOSIS — Z0000 Encounter for general adult medical examination without abnormal findings: Secondary | ICD-10-CM

## 2018-09-07 MED ORDER — BICTEGRAVIR-EMTRICITAB-TENOFOV 50-200-25 MG PO TABS: 1.0000 | ORAL_TABLET | Freq: Every day | ORAL | 11 refills | Status: DC

## 2018-09-07 MED ORDER — SULFAMETHOXAZOLE-TRIMETHOPRIM 800-160 MG PO TABS: 1.0000 | ORAL_TABLET | Freq: Every day | ORAL | 5 refills | Status: DC

## 2018-09-07 NOTE — Progress Notes (Signed)
Name: Brett Carter  DOB: 1991/01/12 MRN: [id number] PCP: Patient, No Pcp Per   Patient Active Problem List   Diagnosis Date Noted  . Healthcare maintenance 03/30/2018  . AIDS (acquired immune deficiency syndrome) (HCC) 03/15/2018     Brief Narrative:  Brett Carter is a 27 y.o. AA male with HIV infection. Originally diagnosed with HIV during hospitalization recently. CD4 nadir 90. His ex-fiance is HIV+ and called to inform him of this a few months ago but Brett Carter thought this was just him being 'attention seeking." History of OIs: pneumonia, oral thrush. HIV Risk: MSM   Previous Regimens: . Biktarvy 2019  Genotypes: . 03/16/18 - no significant mutations; pansensitive   Subjective:  CC:  Brett Carter is here today for follow up on HIV/AIDS disease. He also has a dental appointment today to have partials.   HPI/ROS:  He is doing well on his Biktarvy and has no side effects. Not taking bactrim anymore for about a month now. He missed a dose of his Biktarvy - was taking it in the mornings but changed to the evenings and has done better this way. Endorses no complaints today suggestive of associated opportunistic infection or advancing HIV disease such as fevers, night sweats, weight loss, anorexia, cough, SOB, nausea, vomiting, diarrhea, headache, sensory changes, lymphadenopathy or oral thrush.  Dental appointment today. His husband is also a patient here in the clinic.   Review of Systems  Constitutional: Negative for chills and fever.  HENT: Negative for tinnitus.   Eyes: Negative for blurred vision and photophobia.  Respiratory: Negative for cough, sputum production and shortness of breath.   Cardiovascular: Negative for chest pain.  Gastrointestinal: Negative for diarrhea, nausea and vomiting.  Genitourinary: Negative for dysuria.  Musculoskeletal: Negative for back pain.  Skin: Negative for rash.  Neurological: Negative for dizziness and headaches.    Past Medical  History:  Diagnosis Date  . ADHD   . HIV infection [facility](HCC)     Outpatient Medications Prior to Visit  Medication Sig Dispense Refill  . bictegravir-emtricitabine-tenofovir AF (BIKTARVY) 50-200-25 MG TABS tablet Take 1 tablet by mouth daily. 30 tablet 5  . sulfamethoxazole-trimethoprim (BACTRIM DS,SEPTRA DS) 800-160 MG tablet Take 1 tablet by mouth daily. 30 tablet 4  . sulfamethoxazole-trimethoprim (BACTRIM DS,SEPTRA DS) 800-160 MG tablet TAKE 1 TABLET BY MOUTH DAILY 30 tablet 5   No facility-administered medications prior to visit.      Allergies  Allergen Reactions  . Shellfish-Derived Products Anaphylaxis  . Depakote [Valproic Acid]   . Lexapro [Escitalopram Oxalate]   . Metadate Cd [Methylphenidate]   . Zyprexa [Olanzapine]    Social History   Tobacco Use  . Smoking status: Former Smoker    Packs/day: 0.10    Types: Cigarettes  . Smokeless tobacco: Never Used  . Tobacco comment: quit two weeks ago (06/2018)  Substance Use Topics  . Alcohol use: Yes  . Drug use: No    Social History   Substance and Sexual Activity  Sexual Activity Not Currently  . Birth control/protection: Condom     Objective:   Vitals:   09/07/18 1440  BP: 125/80  Pulse: 76  Temp: 98.8 F (37.1 C)  TempSrc: Oral  Weight: 168 lb (76.2 kg)   Body mass index is 25.54 kg/m.  Physical Exam  Constitutional: He is oriented to person, place, and time. He appears well-developed and well-nourished.  Seated comfortably in chair during visit. Well groomed and appropriately dressed.   HENT:  Mouth/Throat: Oropharynx  is clear and moist and mucous membranes are normal. Normal dentition. No dental abscesses.  Dental extractions recently.   Cardiovascular: Normal rate, regular rhythm and normal heart sounds.  Pulmonary/Chest: Effort normal and breath sounds normal.  Abdominal: Soft. He exhibits no distension. There is no tenderness.  Lymphadenopathy:    He has no cervical adenopathy.    Neurological: He is alert and oriented to person, place, and time.  Skin: Skin is warm and dry. No rash noted.  Psychiatric: He has a normal mood and affect. Judgment normal.  In good spirits today and engaged in care discussion.   Vitals reviewed.   Lab Results Lab Results  Component Value Date   WBC 5.4 03/16/2018   HGB 10.4 (L) 03/16/2018   HCT 31.6 (L) 03/16/2018   MCV 85.6 03/16/2018   PLT 210 03/16/2018    Lab Results  Component Value Date   CREATININE 0.99 03/16/2018   BUN 7 03/16/2018   NA 142 03/16/2018   K 3.7 03/16/2018   CL 109 03/16/2018   CO2 25 03/16/2018    Lab Results  Component Value Date   ALT 22 03/15/2018   AST 35 03/15/2018   ALKPHOS 47 03/15/2018   BILITOT 0.8 03/15/2018    No results found for: CHOL, HDL, LDLCALC, LDLDIRECT, TRIG, CHOLHDL HIV 1 RNA Quant (copies/mL)  Date Value  08/24/2018 <20 DETECTED (A)  04/21/2018 52 (H)  03/15/2018 104,000   CD4 T Cell Abs (/uL)  Date Value  08/24/2018 170 (L)  04/21/2018 200 (L)    Assessment & Plan:   Problem List Items Addressed This Visit      Unprioritized   AIDS (acquired immune deficiency syndrome) (HCC) - Primary    Doing very well on Biktarvy. Reviewed labs with him today. He is pleased and is motivated to continue to do well. Will continue Biktarvy. I want him to continue Bactrim for 3 more months. If stays suppressed throughout will consider stopping as he may fail to fully reconstitute CD4. Discussed U=U concept in addition to safe sex counseling and prevention of other STIs (partner, however is HIV+). He will return in 3 months with labs prior to. Reminded about HMAP re-enrollment in January.   Return in about 3 months (around 12/08/2018).       Relevant Medications   sulfamethoxazole-trimethoprim (BACTRIM DS,SEPTRA DS) 800-160 MG tablet   bictegravir-emtricitabine-tenofovir AF (BIKTARVY) 50-200-25 MG TABS tablet   Other Relevant Orders   HIV-1 RNA quant-no reflex-bld   T-helper  cell (CD4)- ([facility] only)   Healthcare maintenance    Will need Meningogoccal, Hep B and Hep A vaccines now that he has reconstituted CD4 a bit - requested to defer after dental appt.  Prevnar in 02-2019.           Rexene Alberts, MSN, NP-C [facility] for [facility] Pager: [phone] Office: [phone]  09/08/18  9:46 PM

## 2018-09-07 NOTE — Patient Instructions (Addendum)
Continue your Biktarvy and Bactrim every day as you are now.   Space out any vitamins or supplemnts you take by 6 hours (example: take biktarvy in the evening and the vitamins 6 hours before this).   Would like to see you back in 3 months with labs 2 weeks prior to your visit.

## 2018-09-08 NOTE — Assessment & Plan Note (Signed)
Doing very well on Biktarvy. Reviewed labs with him today. He is pleased and is motivated to continue to do well. Will continue Biktarvy. I want him to continue Bactrim for 3 more months. If stays suppressed throughout will consider stopping as he may fail to fully reconstitute CD4. Discussed U=U concept in addition to safe sex counseling and prevention of other STIs (partner, however is HIV+). He will return in 3 months with labs prior to. Reminded about HMAP re-enrollment in January.   Return in about 3 months (around 12/08/2018).

## 2018-09-08 NOTE — Assessment & Plan Note (Signed)
Will need Meningogoccal, Hep B and Hep A vaccines now that he has reconstituted CD4 a bit - requested to defer after dental appt.  Prevnar in 02-2019.

## 2018-12-08 ENCOUNTER — Encounter: Payer: Self-pay | Admitting: Infectious Diseases

## 2018-12-08 ENCOUNTER — Other Ambulatory Visit: Payer: Self-pay

## 2018-12-08 DIAGNOSIS — B20 Human immunodeficiency virus [HIV] disease: Secondary | ICD-10-CM

## 2018-12-09 LAB — T-HELPER CELL (CD4) - (RCID CLINIC ONLY)
CD4 % Helper T Cell: 13 % — ABNORMAL LOW (ref 33–55)
CD4 T Cell Abs: 200 /uL — ABNORMAL LOW (ref 400–2700)

## 2018-12-10 ENCOUNTER — Encounter: Payer: Self-pay | Admitting: Infectious Diseases

## 2018-12-10 LAB — HIV-1 RNA QUANT-NO REFLEX-BLD
HIV 1 RNA Quant: <20 copies/mL — AB
HIV-1 RNA Quant, Log: <1.3 Log copies/mL — AB

## 2018-12-22 ENCOUNTER — Ambulatory Visit (INDEPENDENT_AMBULATORY_CARE_PROVIDER_SITE_OTHER): Payer: Self-pay | Admitting: Infectious Diseases

## 2018-12-22 ENCOUNTER — Encounter: Payer: Self-pay | Admitting: Infectious Diseases

## 2018-12-22 VITALS — BP 126/72 | HR 74 | Temp 98.4°F | Wt 176.0 lb

## 2018-12-22 DIAGNOSIS — Z79899 Other long term (current) drug therapy: Secondary | ICD-10-CM

## 2018-12-22 DIAGNOSIS — Z23 Encounter for immunization: Secondary | ICD-10-CM

## 2018-12-22 DIAGNOSIS — Z789 Other specified health status: Secondary | ICD-10-CM | POA: Insufficient documentation

## 2018-12-22 DIAGNOSIS — Z113 Encounter for screening for infections with a predominantly sexual mode of transmission: Secondary | ICD-10-CM

## 2018-12-22 DIAGNOSIS — Z21 Asymptomatic human immunodeficiency virus [HIV] infection status: Secondary | ICD-10-CM

## 2018-12-22 NOTE — Assessment & Plan Note (Signed)
Continues to do very well. Undetectable labs again. We reviewed labs today and when he first started in care. He is happy with the medication and that it is working well.  Declined STI screening/condoms today.  Discussed U=U concept in addition to safe sex counseling and prevention of other STIs.   He can return in 4 months with lab/financial visit 2 weeks before for HMAP re-enrollment.

## 2018-12-22 NOTE — Addendum Note (Signed)
Addended by: Gerarda Fraction on: 12/22/2018 11:15 AM   Modules accepted: Orders

## 2018-12-22 NOTE — Assessment & Plan Note (Signed)
Start series today now that CD4 > 200. Follow up with RN visit in 4 weeks for HB#2 then final due in September 2020.  Prevnar due May 2020.  Tdap next visit.

## 2018-12-22 NOTE — Progress Notes (Signed)
Name: Brett Carter  DOB: May 22, 1991 MRN: [id number] PCP: Patient, No Pcp Per   Patient Active Problem List   Diagnosis Date Noted  . 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 Healthcare maintenance 03/30/2018  . HIV (human immunodeficiency virus infection) (HCC) 03/15/2018     Brief Narrative:  Brett Carter is a 28 y.o. AA male with HIV infection. Originally diagnosed with HIV during hospitalization recently. CD4 nadir 90. His ex-fiance is HIV+ and called to inform him of this a few months ago but Brett Carter thought this was just him being 'attention seeking." History of OIs: pneumonia, oral thrush. HIV Risk: MSM   Previous Regimens: . Biktarvy 2019 >> suppressed   Genotypes: . 03/16/18 - no significant mutations; pansensitive   Subjective:  CC:  Brett Carter is here today for follow up on HIV/AIDS disease. He also has a dental appointment today to have partials.   HPI/ROS:  Doing well on Biktarvy. Reports he missed 2 doses of medications when he was required to work a double shift at work and he came home way later than expected and missed the dose. He has noticed some weight gain (16lbs or so) since starting on medications regularly. Overall feeling much better over the last 8 months being on medications. Reports no complaints today suggestive of associated opportunistic infection or advancing HIV disease such as fevers, night sweats, weight loss, anorexia, cough, SOB, nausea, vomiting, diarrhea, headache, sensory changes, lymphadenopathy or oral thrush.    He is still working full time, married to his husband. No other sexual partners. He is in care with dental clinic. No concerns over depression/hopeless/sad mood.   Review of Systems  Constitutional: Negative for chills, fever, malaise/fatigue and weight loss.  HENT: Negative for sore throat.        Partials are loose - has dental appt soon  Respiratory: Negative for cough and sputum production.   Cardiovascular:  Negative for chest pain and leg swelling.  Gastrointestinal: Negative for abdominal pain, diarrhea and vomiting.  Genitourinary: Negative for dysuria and flank pain.  Musculoskeletal: Negative for joint pain, myalgias and neck pain.  Skin: Negative for rash.  Neurological: Negative for dizziness, tingling and headaches.  Psychiatric/Behavioral: Negative for depression and substance abuse. The patient is not nervous/anxious and does not have insomnia.       Past Medical History:  Diagnosis Date  . ADHD   . HIV infection [facility])     Outpatient Medications Prior to Visit  Medication Sig Dispense Refill  . bictegravir-emtricitabine-tenofovir AF (BIKTARVY) 50-200-25 MG TABS tablet Take 1 tablet by mouth daily. 30 tablet 11  . sulfamethoxazole-trimethoprim (BACTRIM DS,SEPTRA DS) 800-160 MG tablet Take 1 tablet by mouth daily. 30 tablet 5   No facility-administered medications prior to visit.      Allergies  Allergen Reactions  . Shellfish-Derived Products Anaphylaxis  . Depakote [Valproic Acid]   . Lexapro [Escitalopram Oxalate]   . Metadate Cd [Methylphenidate]   . Zyprexa [Olanzapine]    Social History   Tobacco Use  . Smoking status: Former Smoker    Packs/day: 0.10    Types: Cigarettes  . Smokeless tobacco: Never Used  . Tobacco comment: quit two weeks ago (06/2018)  Substance Use Topics  . Alcohol use: Yes  . Drug use: No    Social History   Substance and Sexual Activity  Sexual Activity Not Currently  . Birth control/protection: Condom     Objective:   Vitals:   12/22/18 0945  BP:  126/72  Pulse: 74  Temp: 98.4 F (36.9 C)  Weight: 176 lb (79.8 kg)   Body mass index is 26.76 kg/m. Physical Exam Vitals signs reviewed.  Constitutional:      Appearance: He is well-developed.     Comments: Seated comfortably in chair during visit.   HENT:     Mouth/Throat:     Dentition: Normal dentition. No dental abscesses.  Cardiovascular:     Rate and Rhythm:  Normal rate and regular rhythm.     Heart sounds: Normal heart sounds.  Pulmonary:     Effort: Pulmonary effort is normal.     Breath sounds: Normal breath sounds.  Abdominal:     General: There is no distension.     Palpations: Abdomen is soft.     Tenderness: There is no abdominal tenderness.  Lymphadenopathy:     Cervical: No cervical adenopathy.  Skin:    General: Skin is warm and dry.     Findings: No rash.  Neurological:     Mental Status: He is alert and oriented to person, place, and time.  Psychiatric:        Judgment: Judgment normal.     Comments: In good spirits today and engaged in care discussion.        Lab Results Lab Results  Component Value Date   WBC 5.4 03/16/2018   HGB 10.4 (L) 03/16/2018   HCT 31.6 (L) 03/16/2018   MCV 85.6 03/16/2018   PLT 210 03/16/2018    Lab Results  Component Value Date   CREATININE 0.99 03/16/2018   BUN 7 03/16/2018   NA 142 03/16/2018   K 3.7 03/16/2018   CL 109 03/16/2018   CO2 25 03/16/2018    Lab Results  Component Value Date   ALT 22 03/15/2018   AST 35 03/15/2018   ALKPHOS 47 03/15/2018   BILITOT 0.8 03/15/2018    No results found for: CHOL, HDL, LDLCALC, LDLDIRECT, TRIG, CHOLHDL HIV 1 RNA Quant (copies/mL)  Date Value  12/08/2018 <20 DETECTED (A)  08/24/2018 <20 DETECTED (A)  04/21/2018 52 (H)   CD4 T Cell Abs (/uL)  Date Value  12/08/2018 200 (L)  08/24/2018 170 (L)  04/21/2018 200 (L)    Assessment & Plan:   Problem List Items Addressed This Visit      Unprioritized   HIV (human immunodeficiency virus infection) (HCC) - Primary    Continues to do very well. Undetectable labs again. We reviewed labs today and when he first started in care. He is happy with the medication and that it is working well.  Declined STI screening/condoms today.  Discussed U=U concept in addition to safe sex counseling and prevention of other STIs.   He can return in 4 months with lab/financial visit 2 weeks before  for HMAP re-enrollment.       Relevant Orders   HIV-1 RNA quant-no reflex-bld   T-helper cell (CD4)- ([facility] clinic only)   COMPLETE METABOLIC PANEL WITH GFR   CBC with Differential/Platelet   Not immune to hepatitis B virus    Start series today now that CD4 > 200. Follow up with RN visit in 4 weeks for HB#2 then final due in September 2020.  Prevnar due May 2020.  Tdap next visit.        Other Visit Diagnoses    Encounter for long-term (current) use of high-risk medication       Relevant Orders   Lipid panel   Urinalysis   Routine  screening for STI (sexually transmitted infection)       Relevant Orders   RPR   Urine cytology ancillary only      Rexene Alberts, MSN, NP-C [facility] Pager: [phone] Office: [phone]  12/22/18  10:24 AM

## 2018-12-22 NOTE — Patient Instructions (Addendum)
Your medication is working very well for you - your labs show that you are undetectable. Please continue your Biktarvy once a day.   Can stop your Bactrim (antibiotic)  Your immune system is better now that we can start giving you some more recommended vaccines.   Please schedule a nurse appointment in 4 weeks month to get your second Hepatitis B shot.  Will finish this with a 3rd shot in September 2020  Please come back to see Judeth Cornfield again in 4 months - this will put you into July.  Please make an appointment 2 weeks before your visit with me for labs and to repeat your HMAP enrollment.

## 2019-01-19 ENCOUNTER — Ambulatory Visit: Payer: Self-pay

## 2019-02-18 ENCOUNTER — Ambulatory Visit: Payer: Self-pay

## 2019-03-23 ENCOUNTER — Other Ambulatory Visit: Payer: Self-pay

## 2019-04-07 ENCOUNTER — Other Ambulatory Visit: Payer: Self-pay

## 2019-04-13 ENCOUNTER — Other Ambulatory Visit: Payer: Self-pay

## 2019-04-13 ENCOUNTER — Encounter: Payer: Self-pay | Admitting: Infectious Diseases

## 2019-04-13 ENCOUNTER — Ambulatory Visit (INDEPENDENT_AMBULATORY_CARE_PROVIDER_SITE_OTHER): Payer: Self-pay | Admitting: Infectious Diseases

## 2019-04-13 VITALS — BP 97/65 | HR 65 | Temp 98.4°F | Wt 192.0 lb

## 2019-04-13 DIAGNOSIS — Z23 Encounter for immunization: Secondary | ICD-10-CM

## 2019-04-13 DIAGNOSIS — Z789 Other specified health status: Secondary | ICD-10-CM

## 2019-04-13 DIAGNOSIS — Z21 Asymptomatic human immunodeficiency virus [HIV] infection status: Secondary | ICD-10-CM

## 2019-04-13 NOTE — Assessment & Plan Note (Signed)
Second hep b dose administered was Heplisav - will need to wait at least 8 weeks until the next administration making today too early (after 8.20.2020). Will give at his next appointment as well as prevnar and influenza.

## 2019-04-13 NOTE — Patient Instructions (Addendum)
Always nice to see you!  Regarding your weight gain -I think you are still at a healthy weight.  You have gained 20 pounds in the last nearly 4 months.  But you had some to regain for sure.  I want you to start paying attention to what you eat, how often you eat, how much you eat and when you eat.   Things I would focus on a reducing some of the carbohydrates and sugars in your diet (potatoes, pasta, rice, bread, cereal, sweets).   If you drink sodas would also recommend to stop.  Increase your water.   Get plenty of sleep (8 hours a night)  [place] - you should check this out with your husband and go take some walks there.  [place]  We gave you your second hepatitis B vaccine today.  At your next appointment we will give you your third and final hepatitis B vaccine and your flu shot.  We will also draw lab work at this appointment.  Please schedule your next visit for 3 months.  As you know ADAP enrollment starts in July.

## 2019-04-13 NOTE — Progress Notes (Signed)
Name: Brett Carter  DOB: [birth year] MRN: [id number] PCP: Patient, No Pcp Per   Patient Active Problem List   Diagnosis Date Noted  . 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 Healthcare maintenance 03/30/2018  . HIV (human immunodeficiency virus infection) (HCC) 03/15/2018     Brief Narrative:  Brett Carter is a 28 y.o. male with HIV Dx 2019 during hospitalization. CD4 nadir 90.  History of OIs: pneumonia, oral thrush.  HIV Risk: MSM (ex-fiance HIV+)  Previous Regimens: . Biktarvy 2019 >> suppressed   Genotypes: . 03/16/18 - no significant mutations; pansensitive   Subjective:  CC:  HIV follow up. Weight gain. No other complaints.   HPI/ROS:  Brett Carter is doing very well since her last office visit.  He continues to work full-time at [employer] but has had some hours.  He has not missed a single dose of his Biktarvy since our last office visit.  He is taking this correctly at the same time every day separated from other multivitamins.  He has not had any trouble with side effects or concerns for accessing his medication.  He told me that he needs to make an appointment with our financial team to re-enroll for ADAP in July.  He is also continuing on his Bactrim once daily.  Monogamous sexual relationship with his husband.  He does not smoke cigarettes.  He has noticed a significant weight gain since his last office visit.  He thinks his last weight was 176 pounds today he is 196.  He feels like he has been at home eating a more liberal and free diet due to quarantine restrictions and has not been able to exercise like he would like.  He says he is eating a lot of bread.  Anxious to get his final dental appointment arranged soon.  No trouble or concerns over depressed or anxious mood.  He is sleeping well.   Review of Systems  Constitutional: Negative for chills, fever, malaise/fatigue and weight loss.  HENT: Negative for sore throat.   Respiratory: Negative for cough and  sputum production.   Cardiovascular: Negative for chest pain and leg swelling.  Gastrointestinal: Negative for abdominal pain, diarrhea and vomiting.  Genitourinary: Negative for dysuria and flank pain.  Musculoskeletal: Negative for joint pain, myalgias and neck pain.  Skin: Negative for rash.  Neurological: Negative for dizziness, tingling and headaches.  Psychiatric/Behavioral: Negative for depression and substance abuse. The patient is not nervous/anxious and does not have insomnia.       Past Medical History:  Diagnosis Date  . ADHD   . HIV infection [facility](HCC)     Outpatient Medications Prior to Visit  Medication Sig Dispense Refill  . bictegravir-emtricitabine-tenofovir AF (BIKTARVY) 50-200-25 MG TABS tablet Take 1 tablet by mouth daily. 30 tablet 11   No facility-administered medications prior to visit.      Allergies  Allergen Reactions  . Shellfish-Derived Products Anaphylaxis  . Depakote [Valproic Acid]   . Lexapro [Escitalopram Oxalate]   . Metadate Cd [Methylphenidate]   . Zyprexa [Olanzapine]    Social History   Tobacco Use  . Smoking status: Former Smoker    Packs/day: 0.10    Types: Cigarettes  . Smokeless tobacco: Never Used  . Tobacco comment: quit two weeks ago (06/2018)  Substance Use Topics  . Alcohol use: Yes  . Drug use: No    Social History   Substance and Sexual Activity  Sexual Activity Not Currently  . Birth control/protection: Condom  Objective:   Vitals:   04/13/19 0947  BP: 97/65  Pulse: 65  Temp: 98.4 F (36.9 C)  TempSrc: Oral  Weight: 192 lb (87.1 kg)   Body mass index is 29.19 kg/m.   Physical Exam Vitals signs reviewed.  Constitutional:      Appearance: He is well-developed.     Comments: Seated comfortably in chair during visit.   HENT:     Mouth/Throat:     Dentition: Normal dentition. No dental abscesses.  Cardiovascular:     Rate and Rhythm: Normal rate and regular rhythm.     Heart sounds: Normal heart  sounds.  Pulmonary:     Effort: Pulmonary effort is normal.     Breath sounds: Normal breath sounds.  Abdominal:     General: There is no distension.     Palpations: Abdomen is soft.     Tenderness: There is no abdominal tenderness.  Lymphadenopathy:     Cervical: No cervical adenopathy.  Skin:    General: Skin is warm and dry.     Findings: No rash.  Neurological:     Mental Status: He is alert and oriented to person, place, and time.  Psychiatric:        Judgment: Judgment normal.     Comments: In good spirits today and engaged in care discussion.      Lab Results Lab Results  Component Value Date   WBC 5.4 03/16/2018   HGB 10.4 (L) 03/16/2018   HCT 31.6 (L) 03/16/2018   MCV 85.6 03/16/2018   PLT 210 03/16/2018    Lab Results  Component Value Date   CREATININE 0.99 03/16/2018   BUN 7 03/16/2018   NA 142 03/16/2018   K 3.7 03/16/2018   CL 109 03/16/2018   CO2 25 03/16/2018    Lab Results  Component Value Date   ALT 22 03/15/2018   AST 35 03/15/2018   ALKPHOS 47 03/15/2018   BILITOT 0.8 03/15/2018    No results found for: CHOL, HDL, LDLCALC, LDLDIRECT, TRIG, CHOLHDL HIV 1 RNA Quant (copies/mL)  Date Value  12/08/2018 <20 DETECTED (A)  08/24/2018 <20 DETECTED (A)  04/21/2018 52 (H)   CD4 T Cell Abs (/uL)  Date Value  12/08/2018 200 (L)  08/24/2018 170 (L)  04/21/2018 200 (L)    Assessment & Plan:   Problem List Items Addressed This Visit      Unprioritized   HIV (human immunodeficiency virus infection) (HCC)    He continues to have excellent control over his HIV.  Last viral load 2 months ago was again undetectable.  We will defer labs today until next appointment.  I congratulated him on his good efforts.  We will continue his Biktarvy once daily.  Last CD4 count was 200 and the one prior was about the same.  He can stop taking his Bactrim and continue on just his Biktarvy alone. No STI testing today.  Declined condoms. We will update vaccines at  next office visit. He can return in 3 months for follow-up care.      Not immune to hepatitis B virus    Second hep b dose administered was Heplisav - will need to wait at least 8 weeks until the next administration making today too early (after 8.20.2020). Will give at his next appointment as well as prevnar and influenza.        Other Visit Diagnoses    Need for hepatitis B vaccination    -  Primary  Relevant Orders   Heplisav-B (HepB-CPG) Vaccine (Completed)      Janene Madeira, MSN, NP-C [facility] [place] Pager: [phone] Office: [phone]  04/13/19  11:17 AM

## 2019-04-13 NOTE — Assessment & Plan Note (Signed)
He continues to have excellent control over his HIV.  Last viral load 2 months ago was again undetectable.  We will defer labs today until next appointment.  I congratulated him on his good efforts.  We will continue his Biktarvy once daily.  Last CD4 count was 200 and the one prior was about the same.  He can stop taking his Bactrim and continue on just his Biktarvy alone. No STI testing today.  Declined condoms. We will update vaccines at next office visit. He can return in 3 months for follow-up care.

## 2019-04-22 ENCOUNTER — Encounter: Payer: Self-pay | Admitting: Infectious Diseases

## 2019-04-22 ENCOUNTER — Other Ambulatory Visit: Payer: Self-pay

## 2019-04-22 ENCOUNTER — Ambulatory Visit: Payer: Self-pay

## 2019-07-15 ENCOUNTER — Ambulatory Visit: Payer: Self-pay | Admitting: Infectious Diseases

## 2020-02-07 ENCOUNTER — Ambulatory Visit: Payer: Self-pay

## 2020-02-07 ENCOUNTER — Other Ambulatory Visit: Payer: Self-pay

## 2020-02-07 DIAGNOSIS — B20 Human immunodeficiency virus [HIV] disease: Secondary | ICD-10-CM

## 2020-02-07 MED ORDER — BIKTARVY 50-200-25 MG PO TABS: 1.0000 | ORAL_TABLET | Freq: Every day | ORAL | 0 refills | Status: DC

## 2020-02-08 ENCOUNTER — Encounter: Payer: Self-pay | Admitting: Infectious Diseases

## 2020-02-09 ENCOUNTER — Other Ambulatory Visit: Payer: Self-pay

## 2020-02-28 ENCOUNTER — Encounter: Payer: Self-pay | Admitting: Infectious Diseases

## 2020-02-28 ENCOUNTER — Other Ambulatory Visit: Payer: Self-pay

## 2020-02-28 ENCOUNTER — Ambulatory Visit (INDEPENDENT_AMBULATORY_CARE_PROVIDER_SITE_OTHER): Payer: Self-pay | Admitting: Infectious Diseases

## 2020-02-28 VITALS — BP 130/74 | HR 70 | Temp 97.6°F | Ht 67.0 in | Wt 199.0 lb

## 2020-02-28 DIAGNOSIS — Z21 Asymptomatic human immunodeficiency virus [HIV] infection status: Secondary | ICD-10-CM

## 2020-02-28 DIAGNOSIS — Z79899 Other long term (current) drug therapy: Secondary | ICD-10-CM

## 2020-02-28 DIAGNOSIS — Z113 Encounter for screening for infections with a predominantly sexual mode of transmission: Secondary | ICD-10-CM

## 2020-02-28 MED ORDER — BIKTARVY 50-200-25 MG PO TABS: 1.0000 | ORAL_TABLET | Freq: Every day | ORAL | 0 refills | Status: DC

## 2020-02-28 NOTE — Assessment & Plan Note (Signed)
Will update pertinent labs today to ensure he is still undetectable and re-stage CD4. He appears well and no findings concerning for advancing disease. Refills provided today.  STI screening with routine blood work, urine if he is able to provide).  Lipid screen today.  He declined COVID vaccine - will administer Prevnar at next appointment per his request.  Return in about 4 months (around 06/30/2020).

## 2020-02-28 NOTE — Progress Notes (Signed)
Name: Brett Carter  DOB: Dec 14, 1990 MRN: [id number] PCP: Patient, No Pcp Per     Patient Active Problem List   Diagnosis Date Noted  . 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 Healthcare maintenance 03/30/2018  . HIV (human immunodeficiency virus infection) (HCC) 03/15/2018     Brief Narrative:  Brett Carter is a 29 y.o. male with HIV Dx 2019 during hospitalization. CD4 nadir 90.  History of OIs: pneumonia, oral thrush.  HIV Risk: MSM (ex-fiance HIV+)  Previous Regimens: . Biktarvy 2019 >> suppressed   Genotypes: . 03/16/18 - no significant mutations; pansensitive   Subjective:  CC:  HIV follow up. Weight gain.    HPI/ROS:  Doing well today - taking his Biktarvy without any missed doses. He does struggle to remember taking it at the same time daily and worries a lot about hiding his medications from other people. No trouble with depressed or anxious mood. He is working full time at [employer] now. Has been gaining weight more rapidly with access to fast food over the last few months. He does feel like this has been only more recently a problem. He does feel hungry often. Has not been to the gym in some time but plans to go back to get in better shape.   He has been seen by our dental team and plugged in with them. No concern for STI symptoms/exposure today   Review of Systems  Constitutional: Positive for unexpected weight change. Negative for chills and fever.  HENT: Negative for sore throat.        No dental problems  Respiratory: Negative for cough.   Cardiovascular: Negative for chest pain and leg swelling.  Gastrointestinal: Negative for abdominal pain, diarrhea and vomiting.  Genitourinary: Negative for dysuria and flank pain.  Musculoskeletal: Negative for myalgias and neck pain.  Skin: Negative for rash.  Neurological: Negative for dizziness and headaches.  Psychiatric/Behavioral: The patient is not nervous/anxious.      Past Medical History:  Diagnosis  Date  . ADHD   . HIV infection [facility])     Outpatient Medications Prior to Visit  Medication Sig Dispense Refill  . bictegravir-emtricitabine-tenofovir AF (BIKTARVY) 50-200-25 MG TABS tablet Take 1 tablet by mouth daily. 30 tablet 0   No facility-administered medications prior to visit.     Allergies  Allergen Reactions  . Shellfish-Derived Products Anaphylaxis  . Depakote [Valproic Acid]   . Lexapro [Escitalopram Oxalate]   . Metadate Cd [Methylphenidate]   . Zyprexa [Olanzapine]    Social History   Tobacco Use  . Smoking status: Former Smoker    Packs/day: 0.10    Types: Cigarettes  . Smokeless tobacco: Never Used  . Tobacco comment: quit two weeks ago (06/2018)  Substance Use Topics  . Alcohol use: Yes  . Drug use: No    Social History   Substance and Sexual Activity  Sexual Activity Not Currently  . Birth control/protection: Condom     Objective:   Vitals:   02/28/20 1050  BP: 130/74  Pulse: 70  Temp: 97.6 F (36.4 C)  SpO2: 96%  Weight: 199 lb (90.3 kg)  Height: 5\' 7"  (1.702 m)   Body mass index is 31.17 kg/m.    Physical Exam Vitals reviewed.  Constitutional:      Appearance: He is well-developed.     Comments: Seated comfortably in chair during visit.   HENT:     Mouth/Throat:     Dentition: Normal dentition. No dental abscesses.  Cardiovascular:     Rate and Rhythm: Normal rate and regular rhythm.     Heart sounds: Normal heart sounds.  Pulmonary:     Effort: Pulmonary effort is normal.     Breath sounds: Normal breath sounds.  Abdominal:     General: There is no distension.     Palpations: Abdomen is soft.     Tenderness: There is no abdominal tenderness.  Lymphadenopathy:     Cervical: No cervical adenopathy.  Skin:    General: Skin is warm and dry.     Findings: No rash.  Neurological:     Mental Status: He is alert and oriented to person, place, and time.  Psychiatric:        Judgment: Judgment normal.     Comments: In good  spirits today and engaged in care discussion.      Lab Results Lab Results  Component Value Date   WBC 5.4 03/16/2018   HGB 10.4 (L) 03/16/2018   HCT 31.6 (L) 03/16/2018   MCV 85.6 03/16/2018   PLT 210 03/16/2018    Lab Results  Component Value Date   CREATININE 0.99 03/16/2018   BUN 7 03/16/2018   NA 142 03/16/2018   K 3.7 03/16/2018   CL 109 03/16/2018   CO2 25 03/16/2018    Lab Results  Component Value Date   ALT 22 03/15/2018   AST 35 03/15/2018   ALKPHOS 47 03/15/2018   BILITOT 0.8 03/15/2018     HIV 1 RNA Quant (copies/mL)  Date Value  12/08/2018 <20 DETECTED (A)  08/24/2018 <20 DETECTED (A)  04/21/2018 52 (H)   CD4 T Cell Abs (/uL)  Date Value  12/08/2018 200 (L)  08/24/2018 170 (L)  04/21/2018 200 (L)    Assessment & Plan:   Problem List Items Addressed This Visit      Unprioritized   HIV (human immunodeficiency virus infection) ([place]) - Primary (Chronic)    Will update pertinent labs today to ensure he is still undetectable and re-stage CD4. He appears well and no findings concerning for advancing disease. Refills provided today.  STI screening with routine blood work, urine if he is able to provide).  Lipid screen today.  He declined COVID vaccine - will administer Prevnar at next appointment per his request.  Return in about 4 months (around 06/30/2020).       Relevant Medications   bictegravir-emtricitabine-tenofovir AF (BIKTARVY) 50-200-25 MG TABS tablet   Other Relevant Orders   HIV-1 RNA quant-no reflex-bld   T-helper cell (CD4)- ([facility] clinic only)   RPR   COMPLETE METABOLIC PANEL WITH GFR   CBC with Differential/Platelet   Lipid panel    Other Visit Diagnoses    High risk medication use       Relevant Orders   Lipid panel   Routine screening for STI (sexually transmitted infection)       Relevant Orders   RPR     Return in about 4 months (around 06/30/2020).   Janene Madeira, MSN, NP-C [facility]  [place] Pager: [phone] Office: [phone]  02/28/20  1:42 PM

## 2020-02-28 NOTE — Patient Instructions (Addendum)
Nice to see you again!  Please continue your Biktarvy every day.  Will keep you in mind for long acting injection treatment  Please come back in 4 months

## 2020-02-29 LAB — T-HELPER CELL (CD4) - (RCID CLINIC ONLY)
CD4 % Helper T Cell: 19 % — ABNORMAL LOW (ref 33–65)
CD4 T Cell Abs: 322 /uL — ABNORMAL LOW (ref 400–1790)

## 2020-03-01 LAB — COMPLETE METABOLIC PANEL WITH GFR
AG Ratio: 1.8 (calc) (ref 1.0–2.5)
ALT: 21 U/L (ref 9–46)
AST: 25 U/L (ref 10–40)
Albumin: 4.8 g/dL (ref 3.6–5.1)
Alkaline phosphatase (APISO): 62 U/L (ref 36–130)
BUN/Creatinine Ratio: 9 (calc) (ref 6–22)
BUN: 13 mg/dL (ref 7–25)
CO2: 28 mmol/L (ref 20–32)
Calcium: 9.5 mg/dL (ref 8.6–10.3)
Chloride: 103 mmol/L (ref 98–110)
Creat: 1.41 mg/dL — ABNORMAL HIGH (ref 0.60–1.35)
GFR, Est African American: 76 mL/min/{1.73_m2} (ref >=60)
GFR, Est Non African American: 66 mL/min/{1.73_m2} (ref >=60)
Globulin: 2.7 g/dL (calc) (ref 1.9–3.7)
Glucose, Bld: 85 mg/dL (ref 65–99)
Potassium: 3.9 mmol/L (ref 3.5–5.3)
Sodium: 138 mmol/L (ref 135–146)
Total Bilirubin: 0.9 mg/dL (ref 0.2–1.2)
Total Protein: 7.5 g/dL (ref 6.1–8.1)

## 2020-03-01 LAB — CBC WITH DIFFERENTIAL/PLATELET
Absolute Monocytes: 253 cells/uL (ref 200–950)
Basophils Absolute: 29 cells/uL (ref 0–200)
Basophils Relative: 0.9 %
Eosinophils Absolute: 262 cells/uL (ref 15–500)
Eosinophils Relative: 8.2 %
HCT: 39.7 % (ref 38.5–50.0)
Hemoglobin: 13.8 g/dL (ref 13.2–17.1)
Lymphs Abs: 1670 cells/uL (ref 850–3900)
MCH: 29.3 pg (ref 27.0–33.0)
MCHC: 34.8 g/dL (ref 32.0–36.0)
MCV: 84.3 fL (ref 80.0–100.0)
MPV: 10.9 fL (ref 7.5–12.5)
Monocytes Relative: 7.9 %
Neutro Abs: 986 cells/uL — ABNORMAL LOW (ref 1500–7800)
Neutrophils Relative %: 30.8 %
Platelets: 174 10*3/uL (ref 140–400)
RBC: 4.71 10*6/uL (ref 4.20–5.80)
RDW: 12.3 % (ref 11.0–15.0)
Total Lymphocyte: 52.2 %
WBC: 3.2 10*3/uL — ABNORMAL LOW (ref 3.8–10.8)

## 2020-03-01 LAB — LIPID PANEL
Cholesterol: 194 mg/dL (ref <200)
HDL: 35 mg/dL — ABNORMAL LOW (ref >=40)
LDL Cholesterol (Calc): 126 mg/dL (calc) — ABNORMAL HIGH
Non-HDL Cholesterol (Calc): 159 mg/dL (calc) — ABNORMAL HIGH (ref <130)
Total CHOL/HDL Ratio: 5.5 (calc) — ABNORMAL HIGH (ref <5.0)
Triglycerides: 214 mg/dL — ABNORMAL HIGH (ref <150)

## 2020-03-01 LAB — HIV-1 RNA QUANT-NO REFLEX-BLD
HIV 1 RNA Quant: <20 copies/mL — AB
HIV-1 RNA Quant, Log: <1.3 Log copies/mL — AB

## 2020-03-01 LAB — RPR: RPR Ser Ql: NONREACTIVE

## 2020-03-24 ENCOUNTER — Other Ambulatory Visit: Payer: Self-pay

## 2020-03-24 ENCOUNTER — Other Ambulatory Visit: Payer: Self-pay | Admitting: Infectious Diseases

## 2020-03-24 DIAGNOSIS — Z21 Asymptomatic human immunodeficiency virus [HIV] infection status: Secondary | ICD-10-CM

## 2020-03-28 IMAGING — CR DG CHEST 2V
2 series · 2 of 2 positions shown · non-contrast
Comparison: None.

CLINICAL DATA: Fever, productive cough and nausea for 3 weeks.

EXAM:
CHEST - 2 VIEW

[w chest pa]
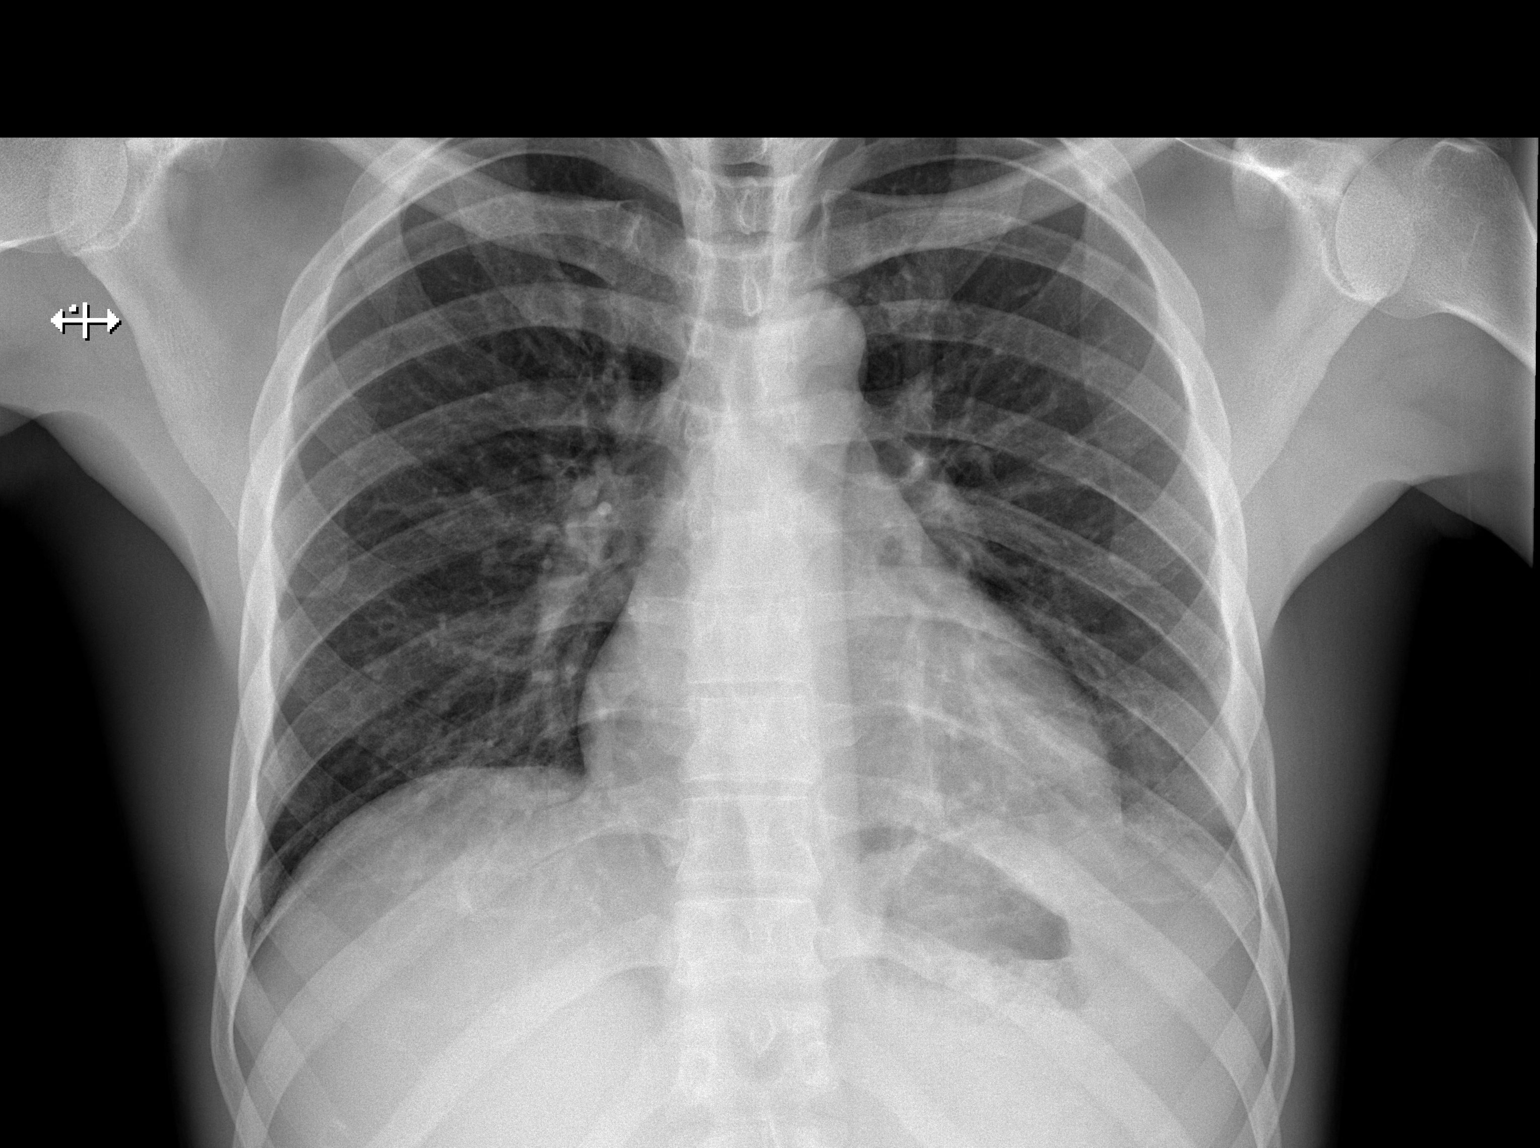

[w chest lat]
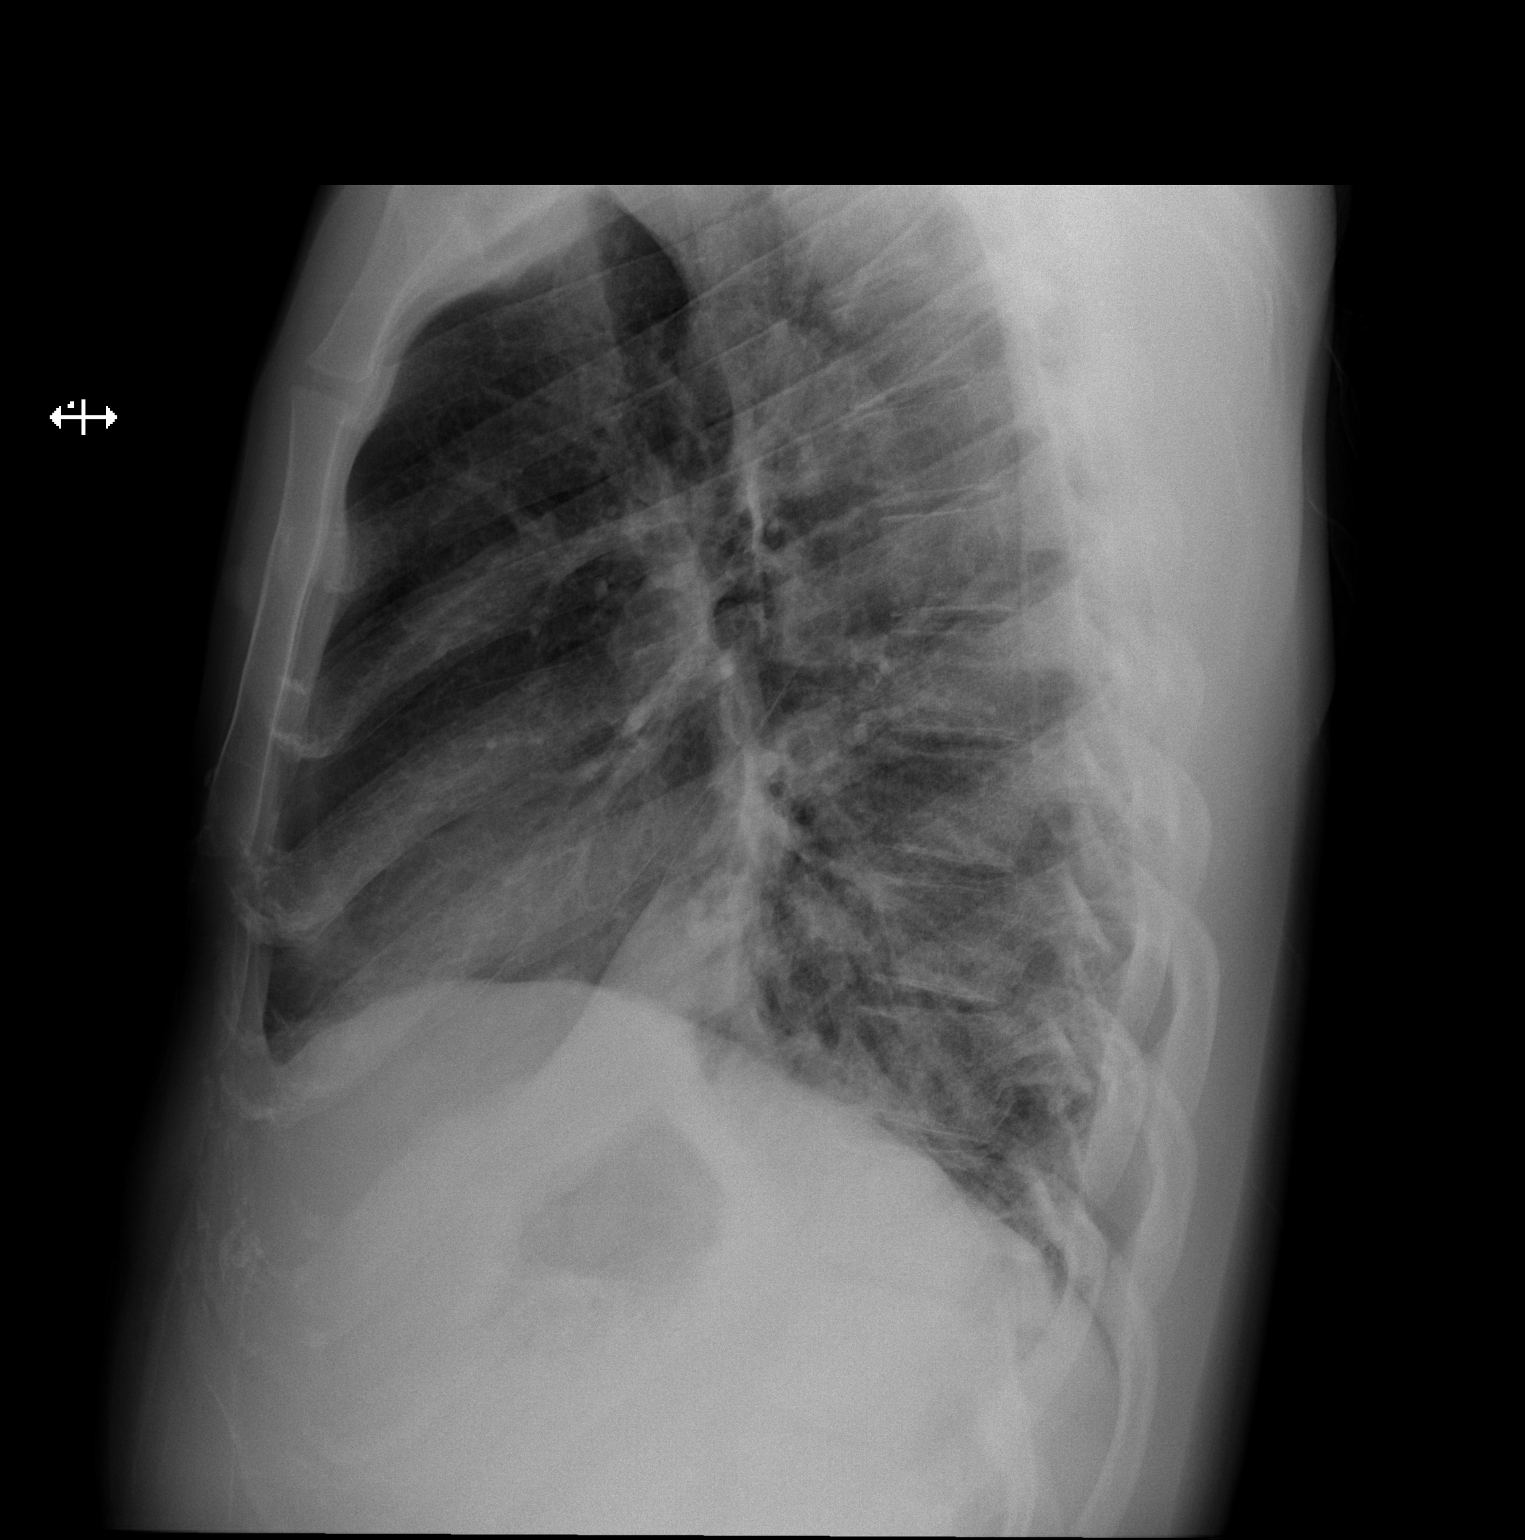

[2 of 2 positions shown; findings below may reference images not displayed]

FINDINGS: Cardiomediastinal silhouette is normal. Mediastinal contours appear
intact.

There is no evidence of pleural effusion or pneumothorax.
Peribronchial airspace consolidation in the left lower lobe

Osseous structures are without acute abnormality. Soft tissues are
grossly normal.
IMPRESSION: Peribronchial airspace consolidation the left lower lobe, concerning
for bronchopneumonia.

## 2020-06-22 ENCOUNTER — Other Ambulatory Visit: Payer: Self-pay

## 2020-06-22 DIAGNOSIS — Z113 Encounter for screening for infections with a predominantly sexual mode of transmission: Secondary | ICD-10-CM

## 2020-06-22 DIAGNOSIS — B20 Human immunodeficiency virus [HIV] disease: Secondary | ICD-10-CM

## 2020-06-22 DIAGNOSIS — Z79899 Other long term (current) drug therapy: Secondary | ICD-10-CM

## 2020-06-22 NOTE — Addendum Note (Signed)
Addended by: Tressa Busman T on: 06/22/2020 03:35 PM   Modules accepted: Orders

## 2020-06-27 ENCOUNTER — Other Ambulatory Visit: Payer: Self-pay

## 2020-07-11 ENCOUNTER — Encounter: Payer: Self-pay | Admitting: Infectious Diseases

## 2020-09-25 ENCOUNTER — Other Ambulatory Visit: Payer: Self-pay

## 2020-09-25 ENCOUNTER — Ambulatory Visit: Payer: Self-pay

## 2020-09-25 DIAGNOSIS — B20 Human immunodeficiency virus [HIV] disease: Secondary | ICD-10-CM

## 2020-09-26 ENCOUNTER — Encounter: Payer: Self-pay | Admitting: Internal Medicine

## 2020-09-26 LAB — T-HELPER CELL (CD4) - (RCID CLINIC ONLY)
CD4 % Helper T Cell: 21 % — ABNORMAL LOW (ref 33–65)
CD4 T Cell Abs: 320 /uL — ABNORMAL LOW (ref 400–1790)

## 2020-09-27 LAB — CBC WITH DIFFERENTIAL/PLATELET
Absolute Monocytes: 229 cells/uL (ref 200–950)
Basophils Absolute: 10 cells/uL (ref 0–200)
Basophils Relative: 0.4 %
Eosinophils Absolute: 21 cells/uL (ref 15–500)
Eosinophils Relative: 0.8 %
HCT: 39.5 % (ref 38.5–50.0)
Hemoglobin: 13.9 g/dL (ref 13.2–17.1)
Lymphs Abs: 1578 cells/uL (ref 850–3900)
MCH: 29.9 pg (ref 27.0–33.0)
MCHC: 35.2 g/dL (ref 32.0–36.0)
MCV: 84.9 fL (ref 80.0–100.0)
MPV: 10.7 fL (ref 7.5–12.5)
Monocytes Relative: 8.8 %
Neutro Abs: 762 cells/uL — ABNORMAL LOW (ref 1500–7800)
Neutrophils Relative %: 29.3 %
Platelets: 160 10*3/uL (ref 140–400)
RBC: 4.65 10*6/uL (ref 4.20–5.80)
RDW: 12.4 % (ref 11.0–15.0)
Total Lymphocyte: 60.7 %
WBC: 2.6 10*3/uL — ABNORMAL LOW (ref 3.8–10.8)

## 2020-09-27 LAB — COMPLETE METABOLIC PANEL WITH GFR
AG Ratio: 1.9 (calc) (ref 1.0–2.5)
ALT: 21 U/L (ref 9–46)
AST: 23 U/L (ref 10–40)
Albumin: 4.7 g/dL (ref 3.6–5.1)
Alkaline phosphatase (APISO): 57 U/L (ref 36–130)
BUN: 17 mg/dL (ref 7–25)
CO2: 31 mmol/L (ref 20–32)
Calcium: 9.3 mg/dL (ref 8.6–10.3)
Chloride: 106 mmol/L (ref 98–110)
Creat: 1.11 mg/dL (ref 0.60–1.35)
GFR, Est African American: 102 mL/min/{1.73_m2} (ref >=60)
GFR, Est Non African American: 88 mL/min/{1.73_m2} (ref >=60)
Globulin: 2.5 g/dL (calc) (ref 1.9–3.7)
Glucose, Bld: 99 mg/dL (ref 65–99)
Potassium: 3.9 mmol/L (ref 3.5–5.3)
Sodium: 141 mmol/L (ref 135–146)
Total Bilirubin: 0.8 mg/dL (ref 0.2–1.2)
Total Protein: 7.2 g/dL (ref 6.1–8.1)

## 2020-09-27 LAB — HIV-1 RNA QUANT-NO REFLEX-BLD
HIV 1 RNA Quant: <20 Copies/mL
HIV-1 RNA Quant, Log: <1.3 Log cps/mL

## 2020-10-09 ENCOUNTER — Encounter: Payer: Self-pay | Admitting: Internal Medicine

## 2020-10-09 ENCOUNTER — Other Ambulatory Visit: Payer: Self-pay

## 2020-10-09 ENCOUNTER — Telehealth: Payer: Self-pay

## 2020-10-09 ENCOUNTER — Ambulatory Visit (INDEPENDENT_AMBULATORY_CARE_PROVIDER_SITE_OTHER): Payer: Self-pay | Admitting: Internal Medicine

## 2020-10-09 DIAGNOSIS — Z21 Asymptomatic human immunodeficiency virus [HIV] infection status: Secondary | ICD-10-CM

## 2020-10-09 NOTE — Progress Notes (Signed)
[facility]   CHIEF COMPLAINT    HIV follow up  SUBJECTIVE:    Brett Carter is a 29 y.o. male with PMHx as below who presents to the clinic for HIV follow up.   Please see A&P for the details of today's visit and status of the patient's medical problems.   Patient's Medications  New Prescriptions   No medications on file  Previous Medications   BIKTARVY 50-200-25 MG TABS TABLET    TAKE 1 TABLET BY MOUTH DAILY  Modified Medications   No medications on file  Discontinued Medications   No medications on file    Past Medical History:  Diagnosis Date  . ADHD   . HIV infection (HCC)     Family History  Problem Relation Age of Onset  . Diabetes Other   . Hypertension Other   . Healthy Mother     Social History   Socioeconomic History  . Marital status: Married    Spouse name: Not on file  . Number of children: Not on file  . Years of education: Not on file  . Highest education level: Not on file  Occupational History  . Not on file  Tobacco Use  . Smoking status: Former Smoker    Packs/day: 0.10    Types: Cigarettes  . Smokeless tobacco: Never Used  . Tobacco comment: quit two weeks ago (06/2018)  Vaping Use  . Vaping Use: Never used  Substance and Sexual Activity  . Alcohol use: Yes  . Drug use: No  . Sexual activity: Not Currently    Birth control/protection: Condom  Other Topics Concern  . Not on file  Social History Narrative  . Not on file   Social Determinants of Health   Financial Resource Strain: Not on file  Food Insecurity: Not on file  Transportation Needs: Not on file  Physical Activity: Not on file  Stress: Not on file  Social Connections: Not on file  Intimate Partner Violence: Not on file    Review of Systems  Constitutional: Negative for chills and fever.  Respiratory: Negative.   Cardiovascular: Negative.   Gastrointestinal: Negative.   Genitourinary: Negative.      OBJECTIVE:     Vitals:   10/09/20 1559  BP: 108/73  Pulse: (!) 103  Temp: 97.9 F (36.6 C)  TempSrc: Oral  Weight: 196 lb (88.9 kg)     Body mass index is 30.7 kg/m.  Physical Exam Constitutional:      General: He is not in acute distress.    Appearance: Normal appearance.  HENT:     Head: Normocephalic and atraumatic.  Pulmonary:     Effort: Pulmonary effort is normal. No respiratory distress.  Neurological:     General: No focal deficit present.     Mental Status: He is alert and oriented to person, place, and time.  Psychiatric:        Mood and Affect: Mood normal.        Behavior: Behavior normal.     Pertinent Labs and Microbiology CMP Latest Ref Rng & Units 09/25/2020 02/28/2020 03/16/2018  Glucose 65 - 99 mg/dL 99 85 93  BUN 7 - 25 mg/dL 17 13 7   Creatinine 0.60 - 1.35 mg/dL 2.33) 0.07(M  Sodium 135 - 146 mmol/L 141 138 142  Potassium 3.5 - 5.3 mmol/L 3.9 3.9 3.7  Chloride 98 - 110 mmol/L 106 103 109  CO2 20 - 32 mmol/L  31 28 25   Calcium 8.6 - 10.3 mg/dL 9.3 9.5 )  Total Protein 6.1 - 8.1 g/dL 7.2 7.5 -  Total Bilirubin 0.2 - 1.2 mg/dL 0.8 0.9 -  Alkaline Phos 38 - 126 U/L - - -  AST 10 - 40 U/L 23 25 -  ALT 9 - 46 U/L 21 21 -   CBC Latest Ref Rng & Units 09/25/2020 02/28/2020 03/16/2018  WBC 3.8 - 10.8 Thousand/uL 2.6(L) 3.2(L) 5.4  Hemoglobin 13.2 - 17.1 g/dL 03/18/2018 50.3 10.4(L)  Hematocrit 38.5 - 50.0 % 39.5 39.7 31.6(L)  Platelets 140 - 400 Thousand/uL 160 174 210     Lab Results  Component Value Date   HIV1RNAQUANT <20 09/25/2020   HIV1RNAQUANT <20 DETECTED (A) 02/28/2020   HIV1RNAQUANT <20 DETECTED (A) 12/08/2018   CD4TABS 320 (L) 09/25/2020   CD4TABS 322 (L) 02/28/2020   CD4TABS 200 (L) 12/08/2018    RPR and STI Lab Results  Component Value Date   LABRPR NON-REACTIVE 02/28/2020    STI Results GC CT  04/09/2018 Negative Negative  04/09/2018 Negative Negative  03/15/2018 Negative Negative  04/03/2017 **POSITIVE**(A) **POSITIVE**(A)     Hepatitis B Lab Results  Component Value Date   HEPBSAG Negative 03/16/2018   Hepatitis C No results found for: HEPCAB, HCVRNAPCRQN Hepatitis A Lab Results  Component Value Date   HAV Negative 03/16/2018   Lipids: Lab Results  Component Value Date   CHOL 194 02/28/2020   TRIG 214 (H) 02/28/2020   HDL 35 (L) 02/28/2020   CHOLHDL 5.5 (H) 02/28/2020   LDLCALC 126 (H) 02/28/2020      ASSESSMENT & PLAN:    HIV (human immunodeficiency virus infection) (HCC) Brett Carter is currently off his ART for about 10 days after running out of his Biktarvy.  Prior to this he reports being fully adherent to his regimen and labs 12/6 showed CD4 count over 300 and undetectable viral load.  Our nurse called the pharmacy and they have refills on file, but report patient has not filled since June.   Patient otherwise states he is doing fine.  He is currently unemployed after leaving his job at [employer].  He reports being in a monogamous relationship with his husband and declines STI screening today.  He reports receiving the flu vaccine earlier this year and thinks he received the COVID vaccine at some point.   Plan: -- Resume Biktarvy -- RTC 3 months with [employer] -- Encouraged to look for vaccine card to confirm COVID vaccination and to get vaccinated/boosted if not done     Brett Carter for Infectious Disease [facility] 10/09/2020, 4:22 PM

## 2020-10-09 NOTE — Patient Instructions (Signed)
Thank you for coming to see me today. It was a pleasure seeing you.  Please schedule a follow up with Judeth Cornfield in about 3 months.  Please pick up your Biktarvy at the pharmacy and take daily.  If you ever run out of your medications, please call us to let us know.    If you have any questions or concerns, please do not hesitate to call the office at [phone].  Take Care,   Gwynn Burly, DO

## 2020-10-09 NOTE — Assessment & Plan Note (Addendum)
Brett Carter is currently off his ART for about 10 days after running out of his Biktarvy.  Prior to this he reports being fully adherent to his regimen and labs 12/6 showed CD4 count over 300 and undetectable viral load.  Our nurse called the pharmacy and they have refills on file, but report patient has not filled since June.   Patient otherwise states he is doing fine.  He is currently unemployed after leaving his job at [employer].  He reports being in a monogamous relationship with his husband and declines STI screening today.  He reports receiving the flu vaccine earlier this year and thinks he received the COVID vaccine at some point.   Plan: -- Resume Biktarvy -- RTC 3 months with Judeth Cornfield -- Encouraged to look for vaccine card to confirm COVID vaccination and to get vaccinated/boosted if not done

## 2020-10-09 NOTE — Telephone Encounter (Signed)
Called pharmacy after patient reported he's been off medication for 10 days. Per pharmacy patient has refills on file,but has not filled with them since June 29th.  Per MD okay to refill medication. Patient understands he must call pharmacy one week before last dose. Provided patient with number to call for refills.

## 2021-04-05 ENCOUNTER — Other Ambulatory Visit: Payer: Self-pay

## 2021-04-05 ENCOUNTER — Ambulatory Visit: Payer: Self-pay

## 2021-04-05 ENCOUNTER — Ambulatory Visit (INDEPENDENT_AMBULATORY_CARE_PROVIDER_SITE_OTHER): Payer: Self-pay | Admitting: Pharmacist

## 2021-04-05 DIAGNOSIS — Z79899 Other long term (current) drug therapy: Secondary | ICD-10-CM

## 2021-04-05 DIAGNOSIS — B20 Human immunodeficiency virus [HIV] disease: Secondary | ICD-10-CM

## 2021-04-05 DIAGNOSIS — Z21 Asymptomatic human immunodeficiency virus [HIV] infection status: Secondary | ICD-10-CM

## 2021-04-05 DIAGNOSIS — Z23 Encounter for immunization: Secondary | ICD-10-CM

## 2021-04-05 DIAGNOSIS — Z113 Encounter for screening for infections with a predominantly sexual mode of transmission: Secondary | ICD-10-CM

## 2021-04-05 MED ORDER — BIKTARVY 50-200-25 MG PO TABS: 1.0000 | ORAL_TABLET | Freq: Every day | ORAL | 3 refills | Status: DC

## 2021-04-05 NOTE — Progress Notes (Signed)
HPI: Brett Carter is a 30 y.o. male who presents to the [facility] pharmacy clinic for HIV follow-up.  Patient Active Problem List   Diagnosis Date Noted   Not immune to hepatitis B virus 12/22/2018   Healthcare maintenance 03/30/2018   HIV (human immunodeficiency virus infection) (HCC) 03/15/2018    Patient's Medications  New Prescriptions   No medications on file  Previous Medications   No medications on file  Modified Medications   Modified Medication Previous Medication   BICTEGRAVIR-EMTRICITABINE-TENOFOVIR AF (BIKTARVY) 50-200-25 MG TABS TABLET BIKTARVY 50-200-25 MG TABS tablet      Take 1 tablet by mouth daily.    TAKE 1 TABLET BY MOUTH DAILY  Discontinued Medications   No medications on file    Allergies: Allergies  Allergen Reactions   Shellfish-Derived Products Anaphylaxis   Depakote [Valproic Acid]    Lexapro [Escitalopram Oxalate]    Metadate Cd [Methylphenidate]    Zyprexa [Olanzapine]     Past Medical History: Past Medical History:  Diagnosis Date   ADHD    HIV infection (HCC)     Social History: Social History   Socioeconomic History   Marital status: Married    Spouse name: Not on file   Number of children: Not on file   Years of education: Not on file   Highest education level: Not on file  Occupational History   Not on file  Tobacco Use   Smoking status: Former    Packs/day: 0.10    Pack years: 0.00    Types: Cigarettes   Smokeless tobacco: Never   Tobacco comments:    quit two weeks ago (06/2018)  Vaping Use   Vaping Use: Never used  Substance and Sexual Activity   Alcohol use: Yes   Drug use: No   Sexual activity: Not Currently    Birth control/protection: Condom  Other Topics Concern   Not on file  Social History Narrative   Not on file   Social Determinants of Health   Financial Resource Strain: Not on file  Food Insecurity: Not on file  Transportation Needs: Not on file  Physical Activity: Not on file  Stress: Not on file   Social Connections: Not on file    Labs: Lab Results  Component Value Date   HIV1RNAQUANT <20 09/25/2020   HIV1RNAQUANT <20 DETECTED (A) 02/28/2020   HIV1RNAQUANT <20 DETECTED (A) 12/08/2018   CD4TABS 320 (L) 09/25/2020   CD4TABS 322 (L) 02/28/2020   CD4TABS 200 (L) 12/08/2018    RPR and STI Lab Results  Component Value Date   LABRPR NON-REACTIVE 02/28/2020    STI Results GC CT  04/09/2018 Negative Negative  04/09/2018 Negative Negative  03/15/2018 Negative Negative  04/03/2017 **POSITIVE**(A) **POSITIVE**(A)    Hepatitis B Lab Results  Component Value Date   HEPBSAG Negative 03/16/2018   Hepatitis C No results found for: HEPCAB, HCVRNAPCRQN Hepatitis A Lab Results  Component Value Date   HAV Negative 03/16/2018   Lipids: Lab Results  Component Value Date   CHOL 194 02/28/2020   TRIG 214 (H) 02/28/2020   HDL 35 (L) 02/28/2020   CHOLHDL 5.5 (H) 02/28/2020   LDLCALC 126 (H) 02/28/2020    Current HIV Regimen: Biktarvy 1 tab once daily  Assessment: Brett Carter presents for HIV follow up accompanied by his HIV+ partner who is also here for an appointment. 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 States that he has been out of [employer] for about 2 weeks, but prior  to that was taking it every day, reports only a few missed doses since last appointment. Asked how this is possible since ADAP expired in March. States that they had extra bottles at home, was not sharing medication between him and his partner.   Hepatitis B surface antibody was previously non-reactive in 02/2018, completed two dose vaccination series June 2020, but have not rechecked surface antibody to confirm immunity. Hepatitis A antibody was also negative at that time but has not been vaccinated against this. He agrees to be vaccinated today. Has also not been vaccinated against COVID-19 but declines vaccination today.   They will see Roe Coombs today to renew ADAP.   Plan:  -Check HIV viral load, CD4  count, CBC w/ diff, CMET, lipid panel, RPR, urine cytology, hepatitis B surface antigen -Administered 1st (of 2 dose series) hepatitis A vaccine, needs to receive 2nd dose in 6-12 months -Continue Biktarvy 1 tablet once daily -Provided 2 weeks of Biktarvy samples to last until ADAP is renewed -Follow up in 1 month with Cassie to recheck viral load after restarting Logan Bores, PharmD PGY1 Pharmacy Resident 04/05/2021 11:18 AM

## 2021-04-06 LAB — T-HELPER CELL (CD4) - (RCID CLINIC ONLY)
CD4 % Helper T Cell: 21 % — ABNORMAL LOW (ref 33–65)
CD4 T Cell Abs: 301 /uL — ABNORMAL LOW (ref 400–1790)

## 2021-04-08 LAB — COMPREHENSIVE METABOLIC PANEL
AG Ratio: 2 (calc) (ref 1.0–2.5)
ALT: 21 U/L (ref 9–46)
AST: 24 U/L (ref 10–40)
Albumin: 4.9 g/dL (ref 3.6–5.1)
Alkaline phosphatase (APISO): 56 U/L (ref 36–130)
BUN: 19 mg/dL (ref 7–25)
CO2: 29 mmol/L (ref 20–32)
Calcium: 9.6 mg/dL (ref 8.6–10.3)
Chloride: 104 mmol/L (ref 98–110)
Creat: 1.21 mg/dL (ref 0.60–1.35)
Globulin: 2.4 g/dL (calc) (ref 1.9–3.7)
Glucose, Bld: 93 mg/dL (ref 65–99)
Potassium: 4.1 mmol/L (ref 3.5–5.3)
Sodium: 139 mmol/L (ref 135–146)
Total Bilirubin: 0.6 mg/dL (ref 0.2–1.2)
Total Protein: 7.3 g/dL (ref 6.1–8.1)

## 2021-04-08 LAB — CBC WITH DIFFERENTIAL/PLATELET
Absolute Monocytes: 203 cells/uL (ref 200–950)
Basophils Absolute: 10 cells/uL (ref 0–200)
Basophils Relative: 0.4 %
Eosinophils Absolute: 133 cells/uL (ref 15–500)
Eosinophils Relative: 5.1 %
HCT: 40.8 % (ref 38.5–50.0)
Hemoglobin: 14.1 g/dL (ref 13.2–17.1)
Lymphs Abs: 1422 cells/uL (ref 850–3900)
MCH: 29.4 pg (ref 27.0–33.0)
MCHC: 34.6 g/dL (ref 32.0–36.0)
MCV: 85.2 fL (ref 80.0–100.0)
MPV: 10.7 fL (ref 7.5–12.5)
Monocytes Relative: 7.8 %
Neutro Abs: 832 cells/uL — ABNORMAL LOW (ref 1500–7800)
Neutrophils Relative %: 32 %
Platelets: 157 10*3/uL (ref 140–400)
RBC: 4.79 10*6/uL (ref 4.20–5.80)
RDW: 12.5 % (ref 11.0–15.0)
Total Lymphocyte: 54.7 %
WBC: 2.6 10*3/uL — ABNORMAL LOW (ref 3.8–10.8)

## 2021-04-08 LAB — LIPID PANEL
Cholesterol: 198 mg/dL (ref <200)
HDL: 39 mg/dL — ABNORMAL LOW (ref >=40)
LDL Cholesterol (Calc): 126 mg/dL (calc) — ABNORMAL HIGH
Non-HDL Cholesterol (Calc): 159 mg/dL (calc) — ABNORMAL HIGH (ref <130)
Total CHOL/HDL Ratio: 5.1 (calc) — ABNORMAL HIGH (ref <5.0)
Triglycerides: 212 mg/dL — ABNORMAL HIGH (ref <150)

## 2021-04-08 LAB — HEPATITIS B SURFACE ANTIGEN: Hepatitis B Surface Ag: NONREACTIVE

## 2021-04-08 LAB — HIV-1 RNA QUANT-NO REFLEX-BLD
HIV 1 RNA Quant: 22 Copies/mL — ABNORMAL HIGH
HIV-1 RNA Quant, Log: 1.35 Log cps/mL — ABNORMAL HIGH

## 2021-04-08 LAB — RPR: RPR Ser Ql: NONREACTIVE

## 2021-04-09 ENCOUNTER — Encounter: Payer: Self-pay | Admitting: Student

## 2021-04-20 ENCOUNTER — Emergency Department (HOSPITAL_COMMUNITY)
Admission: EM | Admit: 2021-04-20 | Discharge: 2021-04-20 | Disposition: A | Discharge status: Home / Self Care | Payer: Self-pay | Attending: Emergency Medicine | Admitting: Emergency Medicine

## 2021-04-20 ENCOUNTER — Encounter (HOSPITAL_COMMUNITY): Payer: Self-pay | Admitting: Emergency Medicine

## 2021-04-20 DIAGNOSIS — N341 Nonspecific urethritis: Secondary | ICD-10-CM | POA: Insufficient documentation

## 2021-04-20 DIAGNOSIS — Z21 Asymptomatic human immunodeficiency virus [HIV] infection status: Secondary | ICD-10-CM | POA: Insufficient documentation

## 2021-04-20 DIAGNOSIS — Z87891 Personal history of nicotine dependence: Secondary | ICD-10-CM | POA: Insufficient documentation

## 2021-04-20 DIAGNOSIS — Z79899 Other long term (current) drug therapy: Secondary | ICD-10-CM | POA: Insufficient documentation

## 2021-04-20 DIAGNOSIS — J069 Acute upper respiratory infection, unspecified: Secondary | ICD-10-CM

## 2021-04-20 DIAGNOSIS — Z20822 Contact with and (suspected) exposure to covid-19: Secondary | ICD-10-CM | POA: Insufficient documentation

## 2021-04-20 DIAGNOSIS — N342 Other urethritis: Secondary | ICD-10-CM

## 2021-04-20 LAB — URINALYSIS, ROUTINE W REFLEX MICROSCOPIC
Bacteria, UA: NONE SEEN
Bilirubin Urine: NEGATIVE
Glucose, UA: NEGATIVE mg/dL
Ketones, ur: NEGATIVE mg/dL
Nitrite: NEGATIVE
Protein, ur: NEGATIVE mg/dL
Specific Gravity, Urine: 1.026 (ref 1.005–1.030)
pH: 5 (ref 5.0–8.0)

## 2021-04-20 LAB — GROUP A STREP BY PCR: Group A Strep by PCR: NOT DETECTED

## 2021-04-20 MED ORDER — CEFTRIAXONE SODIUM 1 G IJ SOLR
500.0000 mg | Freq: Once | INTRAMUSCULAR | Status: AC
  Administered 2021-04-20: 500 mg via INTRAMUSCULAR
  Filled 2021-04-20: qty 10

## 2021-04-20 MED ORDER — AZITHROMYCIN 250 MG PO TABS
1000.0000 mg | ORAL_TABLET | Freq: Once | ORAL | Status: AC
  Administered 2021-04-20: 1000 mg via ORAL
  Filled 2021-04-20: qty 4

## 2021-04-20 MED ORDER — LIDOCAINE HCL 1 % IJ SOLN
INTRAMUSCULAR | Status: AC
  Filled 2021-04-20: qty 20

## 2021-04-20 NOTE — ED Provider Notes (Signed)
Emergency Medicine Provider Triage Evaluation Note  Brett Carter , a 30 y.o. male  was evaluated in triage.  Pt complains of sore throat for the past few days.  Reports headache and cough as well.  No chest pain or shortness of breath.  Review of Systems  Positive: Sore throat, headache Negative: Chest pain, shortness of breath  Physical Exam  BP 120/71 (BP Location: Left Arm)   Pulse 86   Temp 98 F (36.7 C) (Oral)   Resp 16   Ht 5\' 7"  (1.702 m)   Wt 86.2 kg   SpO2 98%   BMI 29.76 kg/m  Gen:   Awake, no distress   Resp:  Normal effort  MSK:   Moves extremities without difficulty  Other:  Tonsils without exudates or edema  Medical Decision Making  Medically screening exam initiated at 4:59 PM.  Appropriate orders placed.  Brett Carter was informed that the remainder of the evaluation will be completed by another provider, this initial triage assessment does not replace that evaluation, and the importance of remaining in the ED until their evaluation is complete.  Strep test ordered   Ammie Dalton, PA-C 04/20/21 1700    06/21/21, MD 04/21/21 [phone]

## 2021-04-20 NOTE — Discharge Instructions (Signed)
Please read and follow all provided instructions.  Your diagnoses today include:  1. Urethritis   2. Upper respiratory tract infection, unspecified type     Tests performed today include: Test for gonorrhea and chlamydia Urinalysis - shows some infection fighting cells Strep test -is negative COVID test -pending, will likely return tomorrow Vital signs. See below for your results today.   Medications:  For treatment of gonorrhea: You were treated with a rocephin (shot) today.   For treatment of chlamydia: You were given azithromycin pills.   Home care instructions:  Read educational materials contained in this packet and follow any instructions provided.   You should tell your partners about your infection and avoid having sex for one week to allow time for the medicine to work.  Sexually transmitted disease testing also available at:  [facility] [place], [facility] [address] [place], phone [phone] or [phone]   Monday - Friday, call for an appointment  Return instructions:  Please return to the Emergency Department if you experience worsening symptoms.  Please return if you have any other emergent concerns.  Additional Information:  Your vital signs today were: BP 120/71 (BP Location: Left Arm)   Pulse 86   Temp 98 F (36.7 C) (Oral)   Resp 16   Ht 5\' 7"  (1.702 m)   Wt 86.2 kg   SpO2 98%   BMI 29.76 kg/m  If your blood pressure (BP) was elevated above 135/85 this visit, please have this repeated by your doctor within one month. --------------

## 2021-04-20 NOTE — ED Triage Notes (Signed)
Pt states that he started having headache and sore throat yesterday. Tried cough drops with no relief. Pt also states that it burns when he urinates and he has had unprotected sex recently. Alert and oriented.

## 2021-04-20 NOTE — ED Provider Notes (Signed)
[facility]-EMERGENCY DEPT Provider Note   CSN: [id number] Arrival date & time: 04/20/21  1611     History Chief Complaint  Patient presents with   Sore Throat   Headache    Brett Carter is a 30 y.o. male.  Patient with history of HIV, recent CD4 count in the 400s presents to the emergency department for multiple complaints.  Patient states that about 6 days ago he developed burning with urination and intermittent yellow penile discharge.  He reports risky sexual contact and is concerned about sexual transmitted infection.  Yesterday he began to have a headache with associated sore throat, body aches.  Mild nonproductive cough today with congestion.  He denies fevers.  He had chills last night.  No chest pain or shortness of breath.  He has received COVID vaccines.  He took a home test yesterday which was reportedly negative.  He reports some recent missed doses of his HIV medication because he ran out, but states that he is back on it now.      Past Medical History:  Diagnosis Date   ADHD    HIV infection [facility])     Patient Active Problem List   Diagnosis Date Noted   Not immune to hepatitis B virus 12/22/2018   Healthcare maintenance 03/30/2018   HIV (human immunodeficiency virus infection) (HCC) 03/15/2018    Past Surgical History:  Procedure Laterality Date   RECTAL SURGERY         Family History  Problem Relation Age of Onset   Diabetes Other    Hypertension Other    Healthy Mother     Social History   Tobacco Use   Smoking status: Former    Packs/day: 0.10    Pack years: 0.00    Types: Cigarettes   Smokeless tobacco: Never   Tobacco comments:    quit two weeks ago (06/2018)  Vaping Use   Vaping Use: Never used  Substance Use Topics   Alcohol use: Yes   Drug use: No    Home Medications Prior to Admission medications   Medication Sig Start Date End Date Taking? Authorizing Provider  bictegravir-emtricitabine-tenofovir AF  (BIKTARVY) 50-200-25 MG TABS tablet Take 1 tablet by mouth daily. 04/05/21   Kuppelweiser, Cassie L, RPH-CPP    Allergies    Shellfish-derived products, Depakote [valproic acid], Lexapro [escitalopram oxalate], Metadate cd [methylphenidate], and Zyprexa [olanzapine]  Review of Systems   Review of Systems  Constitutional:  Positive for chills. Negative for fatigue and fever.  HENT:  Positive for congestion and sore throat. Negative for ear pain, rhinorrhea and sinus pressure.   Eyes:  Negative for redness.  Respiratory:  Positive for cough. Negative for shortness of breath and wheezing.   Gastrointestinal:  Negative for abdominal pain, diarrhea, nausea and vomiting.  Genitourinary:  Positive for dysuria. Negative for hematuria, scrotal swelling and testicular pain.  Musculoskeletal:  Negative for myalgias and neck stiffness.  Skin:  Negative for rash.  Neurological:  Positive for headaches.  Hematological:  Negative for adenopathy.  Psychiatric/Behavioral:  Negative for confusion.    Physical Exam Updated Vital Signs BP 120/71 (BP Location: Left Arm)   Pulse 86   Temp 98 F (36.7 C) (Oral)   Resp 16   Ht 5\' 7"  (1.702 m)   Wt 86.2 kg   SpO2 98%   BMI 29.76 kg/m   Physical Exam Vitals and nursing note reviewed.  Constitutional:      General: He is not in acute  distress.    Appearance: He is well-developed.  HENT:     Head: Normocephalic and atraumatic.     Jaw: No trismus.     Right Ear: Tympanic membrane, ear canal and external ear normal.     Left Ear: Tympanic membrane, ear canal and external ear normal.     Nose: Nose normal. No mucosal edema or rhinorrhea.     Mouth/Throat:     Mouth: Mucous membranes are moist. Mucous membranes are not dry.     Pharynx: Uvula midline. No oropharyngeal exudate, posterior oropharyngeal erythema or uvula swelling.     Tonsils: No tonsillar abscesses.  Eyes:     General:        Right eye: No discharge.        Left eye: No discharge.      Conjunctiva/sclera: Conjunctivae normal.  Cardiovascular:     Rate and Rhythm: Normal rate and regular rhythm.     Heart sounds: Normal heart sounds.  Pulmonary:     Effort: Pulmonary effort is normal. No respiratory distress.     Breath sounds: Normal breath sounds. No wheezing or rales.  Abdominal:     Palpations: Abdomen is soft.     Tenderness: There is no abdominal tenderness.  Musculoskeletal:     Cervical back: Normal range of motion and neck supple.  Lymphadenopathy:     Cervical: No cervical adenopathy.  Skin:    General: Skin is warm and dry.  Neurological:     Mental Status: He is alert.    ED Results / Procedures / Treatments   Labs (all labs ordered are listed, but only abnormal results are displayed) Labs Reviewed  GROUP A STREP BY PCR  SARS CORONAVIRUS 2 (TAT 6-24 HRS)  URINALYSIS, ROUTINE W REFLEX MICROSCOPIC  GC/CHLAMYDIA PROBE AMP ([place]) NOT AT [facility]    EKG None  Radiology No results found.  Procedures Procedures   Medications Ordered in ED Medications  cefTRIAXone (ROCEPHIN) injection 500 mg (has no administration in time range)  azithromycin (ZITHROMAX) tablet 1,000 mg (has no administration in time range)    ED Course  I have reviewed the triage vital signs and the nursing notes.  Pertinent labs & imaging results that were available during my care of the patient were reviewed by me and considered in my medical decision making (see chart for details).  Patient seen and examined.  Patient appears well, nontoxic.  Afebrile.  In regards to URI symptoms, will check COVID testing.  Strep ordered in triage, pending.  Otherwise symptomatic control indicated.  OTC meds for headache.  No concern for meningitis or encephalitis at this time.  In regards to dysuria, concerning for urethritis.  Patient will be given IM Rocephin and azithromycin.  Urine testing for gonorrhea and chlamydia will be sent and will check UA here.  Patient denies  active discharge.  Vital signs reviewed and are as follows: BP 120/71 (BP Location: Left Arm)   Pulse 86   Temp 98 F (36.7 C) (Oral)   Resp 16   Ht 5\' 7"  (1.702 m)   Wt 86.2 kg   SpO2 98%   BMI 29.76 kg/m   Encouraged PCP follow-up.  Brett Carter was evaluated in Emergency Department on 04/20/2021 for the symptoms described in the history of present illness. He was evaluated in the context of the global COVID-19 pandemic, which necessitated consideration that the patient might be at risk for infection with the SARS-CoV-2 virus that causes COVID-19. Institutional protocols  and algorithms that pertain to the evaluation of patients at risk for COVID-19 are in a state of rapid change based on information released by regulatory bodies including the CDC and federal and state organizations. These policies and algorithms were followed during the patient's care in the ED.  Will test and treat for STD exposure. Patient offered HIV and syphilis testing. Patient counseled on safe sexual practices. Told them that they should not have sexual contact for next 7 days and that they need to inform sexual partners so that they can get tested and treated as well. Patient verbalizes understanding and agrees with plan.      MDM Rules/Calculators/A&P                          URI symptoms, mild, testing as above.  Symptomatic care indicated at this time.  Urethritis symptoms, testing as above.  UA with WBCs. Discussed treatment with patient, he would like to be empirically treated.  Awaiting testing in the next couple of days.  Recent RPR was negative.   Final Clinical Impression(s) / ED Diagnoses Final diagnoses:  Urethritis  Upper respiratory tract infection, unspecified type    Rx / DC Orders ED Discharge Orders     None        Renne Crigler, Cordelia Poche 04/20/21 2043    Maia Plan, MD 04/20/21 [phone]

## 2021-04-21 LAB — SARS CORONAVIRUS 2 (TAT 6-24 HRS): SARS Coronavirus 2: NEGATIVE

## 2021-04-24 LAB — GC/CHLAMYDIA PROBE AMP (~~LOC~~) NOT AT ARMC
Chlamydia: NEGATIVE
Comment: NEGATIVE
Comment: NORMAL
Neisseria Gonorrhea: NEGATIVE

## 2021-04-25 ENCOUNTER — Telehealth: Payer: Self-pay

## 2021-04-25 NOTE — Telephone Encounter (Signed)
Pt is calling to receive his negative Covid test results. Pt expressed understanding. Pt also was given the number to [facility] to establish care

## 2021-05-10 ENCOUNTER — Ambulatory Visit: Payer: Self-pay

## 2021-05-10 ENCOUNTER — Ambulatory Visit: Payer: Self-pay | Admitting: Pharmacist

## 2021-05-23 ENCOUNTER — Ambulatory Visit: Payer: Self-pay

## 2021-08-08 ENCOUNTER — Ambulatory Visit: Payer: Self-pay

## 2021-08-08 ENCOUNTER — Other Ambulatory Visit: Payer: Self-pay

## 2021-08-08 DIAGNOSIS — B20 Human immunodeficiency virus [HIV] disease: Secondary | ICD-10-CM

## 2021-08-09 ENCOUNTER — Ambulatory Visit: Payer: Self-pay

## 2021-08-09 ENCOUNTER — Other Ambulatory Visit: Payer: Self-pay

## 2021-08-09 ENCOUNTER — Other Ambulatory Visit: Payer: Self-pay | Admitting: Pharmacist

## 2021-08-09 DIAGNOSIS — B20 Human immunodeficiency virus [HIV] disease: Secondary | ICD-10-CM

## 2021-08-09 MED ORDER — BIKTARVY 50-200-25 MG PO TABS
1.0000 | ORAL_TABLET | Freq: Every day | ORAL | 0 refills | Status: AC
Start: 2021-08-09 — End: 2021-08-23

## 2021-08-09 NOTE — Progress Notes (Signed)
Medication Samples have been provided to the patient.  Drug name: Biktarvy        Strength: 50/200/25 mg       Qty: 2 bottles (14 tablets)  LOT: [id number]   Exp.Date: 07/22/2023  Dosing instructions: Take one tablet by mouth once daily  The patient has been instructed regarding the correct time, dose, and frequency of taking this medication, including desired effects and most common side effects.   Rahshawn Remo L. Silva Aamodt, PharmD, BCIDP, AAHIVP, CPP Clinical Pharmacist Practitioner Infectious Diseases Clinical Pharmacist [facility] 10/02/2020, 10:07 AM  

## 2021-08-20 ENCOUNTER — Emergency Department (HOSPITAL_COMMUNITY)
Admission: EM | Admit: 2021-08-20 | Discharge: 2021-08-21 | Disposition: A | Discharge status: Home / Self Care | Payer: Self-pay | Attending: Emergency Medicine | Admitting: Emergency Medicine

## 2021-08-20 DIAGNOSIS — Z79899 Other long term (current) drug therapy: Secondary | ICD-10-CM | POA: Insufficient documentation

## 2021-08-20 DIAGNOSIS — Z20822 Contact with and (suspected) exposure to covid-19: Secondary | ICD-10-CM | POA: Insufficient documentation

## 2021-08-20 DIAGNOSIS — Z87891 Personal history of nicotine dependence: Secondary | ICD-10-CM | POA: Insufficient documentation

## 2021-08-20 DIAGNOSIS — J029 Acute pharyngitis, unspecified: Secondary | ICD-10-CM | POA: Insufficient documentation

## 2021-08-20 DIAGNOSIS — Z21 Asymptomatic human immunodeficiency virus [HIV] infection status: Secondary | ICD-10-CM | POA: Insufficient documentation

## 2021-08-21 ENCOUNTER — Encounter (HOSPITAL_COMMUNITY): Payer: Self-pay

## 2021-08-21 ENCOUNTER — Other Ambulatory Visit: Payer: Self-pay

## 2021-08-21 LAB — RESP PANEL BY RT-PCR (FLU A&B, COVID) ARPGX2
Influenza A by PCR: NEGATIVE
Influenza B by PCR: NEGATIVE
SARS Coronavirus 2 by RT PCR: NEGATIVE

## 2021-08-21 MED ORDER — LIDOCAINE VISCOUS HCL 2 % MT SOLN: 15.0000 mL | OROMUCOSAL | 0 refills | Status: AC | PRN

## 2021-08-21 NOTE — ED Provider Notes (Signed)
[facility]-EMERGENCY DEPT Provider Note   CSN: [id number] Arrival date & time: 08/20/21  2334     History Chief Complaint  Patient presents with   Sore Throat    Brett Carter is a 30 y.o. male.  The history is provided by the patient and medical records.  Sore Throat  30 year old male presenting to the ED with sore throat.  States over the past 5 days he has had runny nose, cough, and mild URI type symptoms.  States most of his symptoms are resolved about 2 days ago, but has persistent sore throat and is now "losing his voice".  States he returned to work today and was sent home told him he needed to be evaluated and to have a doctor's note.  He denies any fevers or chills.  Does have history of HIV but has been compliant with medications.  Past Medical History:  Diagnosis Date   ADHD    HIV infection [facility])     Patient Active Problem List   Diagnosis Date Noted   Not immune to hepatitis B virus 12/22/2018   Healthcare maintenance 03/30/2018   HIV (human immunodeficiency virus infection) (HCC) 03/15/2018    Past Surgical History:  Procedure Laterality Date   RECTAL SURGERY         Family History  Problem Relation Age of Onset   Diabetes Other    Hypertension Other    Healthy Mother     Social History   Tobacco Use   Smoking status: Former    Packs/day: 0.10    Types: Cigarettes   Smokeless tobacco: Never   Tobacco comments:    quit two weeks ago (06/2018)  Vaping Use   Vaping Use: Never used  Substance Use Topics   Alcohol use: Yes   Drug use: No    Home Medications Prior to Admission medications   Medication Sig Start Date End Date Taking? Authorizing Provider  lidocaine (XYLOCAINE) 2 % solution Use as directed 15 mLs in the mouth or throat every 4 (four) hours as needed for mouth pain. 08/21/21  Yes Garlon Hatchet, PA-C  bictegravir-emtricitabine-tenofovir AF (BIKTARVY) 50-200-25 MG TABS tablet Take 1 tablet by mouth daily.  04/05/21   Kuppelweiser, Cassie L, RPH-CPP  bictegravir-emtricitabine-tenofovir AF (BIKTARVY) 50-200-25 MG TABS tablet Take 1 tablet by mouth daily for 14 days. 08/09/21 08/23/21  Kuppelweiser, Cassie L, RPH-CPP    Allergies    Shellfish-derived products, Depakote [valproic acid], Lexapro [escitalopram oxalate], Metadate cd [methylphenidate], and Zyprexa [olanzapine]  Review of Systems   Review of Systems  HENT:  Positive for sore throat.   All other systems reviewed and are negative.  Physical Exam Updated Vital Signs BP 120/84 (BP Location: Right Arm)   Pulse 62   Temp 98.9 F (37.2 C) (Oral)   Resp 16   SpO2 97%   Physical Exam Vitals and nursing note reviewed.  Constitutional:      Appearance: He is well-developed.  HENT:     Head: Normocephalic and atraumatic.     Mouth/Throat:     Comments: + PND present, no tonsillar edema or exudates, handling secretions well, no stridor, voice is mildly hoarse Eyes:     Conjunctiva/sclera: Conjunctivae normal.     Pupils: Pupils are equal, round, and reactive to light.  Cardiovascular:     Rate and Rhythm: Normal rate and regular rhythm.     Heart sounds: Normal heart sounds.  Pulmonary:     Effort: Pulmonary effort is normal.  Breath sounds: Normal breath sounds.  Abdominal:     General: Bowel sounds are normal.     Palpations: Abdomen is soft.  Musculoskeletal:        General: Normal range of motion.     Cervical back: Normal range of motion.  Lymphadenopathy:     Comments: No lymphadenopathy  Skin:    General: Skin is warm and dry.  Neurological:     Mental Status: He is alert and oriented to person, place, and time.    ED Results / Procedures / Treatments   Labs (all labs ordered are listed, but only abnormal results are displayed) Labs Reviewed  RESP PANEL BY RT-PCR (FLU A&B, COVID) [id number]    EKG None  Radiology No results found.  Procedures Procedures   Medications Ordered in ED Medications - No  data to display  ED Course  I have reviewed the triage vital signs and the nursing notes.  Pertinent labs & imaging results that were available during my care of the patient were reviewed by me and considered in my medical decision making (see chart for details).    MDM Rules/Calculators/A&P                           30 year old male presenting to the ED with sore throat.  Has had a few days of URI type symptoms, most of which resolved aside from sore throat.  States now he feels like he is "losing his voice".  He is afebrile and nontoxic.  Does have some postnasal drip but no tonsillar edema or exudates.  He is seen excretions well, no stridor.  His voice does sound a little hoarse.  Respiratory panel is negative for COVID and flu.  Suspect this is likely viral process, may have a little laryngitis as well.  Feel he is stable for discharge home with continued symptomatic care, encouraged to rest, hydrate, rest his voice.  Given work note and negative covid test results.  Follow-up with PCP.  Return here for new concerns.  Final Clinical Impression(s) / ED Diagnoses Final diagnoses:  Sore throat    Rx / DC Orders ED Discharge Orders          Ordered    lidocaine (XYLOCAINE) 2 % solution  Every 4 hours PRN        08/21/21 0508             Garlon Hatchet, PA-C 08/21/21 0536    Glynn Octave, MD 08/21/21 [phone]

## 2021-08-21 NOTE — Discharge Instructions (Signed)
Take the prescribed medication as directed.  Try to rest your voice.  Can use warm tea, soup/broth, popsicles, etc as well to help. Follow-up with your primary care doctor. Return to the ED for new or worsening symptoms.

## 2021-08-21 NOTE — ED Triage Notes (Signed)
Pt has had a sore throat, runny nose, and cough x 5 days.

## 2021-08-23 ENCOUNTER — Encounter: Payer: Self-pay | Admitting: Internal Medicine

## 2021-08-23 NOTE — Progress Notes (Deleted)
[facility] for Infect[facility]   CHIEF COMPLAINT    HIV follow up.  ***  SUBJECTIVE:    Brett Carter is a 30 y.o. male with PMHx as below who presents to the clinic for HIV follow up.   Patient was seen in our clinic in December 2021 at which point he had been off ART for about 10 days but labs drawn prior to his lapse in adherence showed that his VL was undetectable.  He then had a pharmacy visit in June 2022 and had been without Biktarvy for about 2 weeks.  Viral load that day was 22 copies.  He filled out his ADAP paperwork that day as well.  He did not follow up with Cassie as planned in July.  He was seen earlier this week in the ED for sore throat and diagnosed with post nasal drip and a suspected viral illness.   Today, reports ***.     Please see A&P for the details of today's visit and status of the patient's medical problems.   Patient's Medications  New Prescriptions   No medications on file  Previous Medications   BICTEGRAVIR-EMTRICITABINE-TENOFOVIR AF (BIKTARVY) 50-200-25 MG TABS TABLET    Take 1 tablet by mouth daily.   BICTEGRAVIR-EMTRICITABINE-TENOFOVIR AF (BIKTARVY) 50-200-25 MG TABS TABLET    Take 1 tablet by mouth daily for 14 days.   LIDOCAINE (XYLOCAINE) 2 % SOLUTION    Use as directed 15 mLs in the mouth or throat every 4 (four) hours as needed for mouth pain.  Modified Medications   No medications on file  Discontinued Medications   No medications on file      Past Medical History:  Diagnosis Date   ADHD    HIV infection ([facility])     Social History   Tobacco Use   Smoking status: Former    Packs/day: 0.10    Types: Cigarettes   Smokeless tobacco: Never   Tobacco comments:    quit two weeks ago (06/2018)  Vaping Use   Vaping Use: Never used  Substance Use Topics   Alcohol use: Yes   Drug use: No    Family History  Problem Relation Age of Onset   Diabetes Other    Hypertension Other    Healthy Mother     Allergies   Allergen Reactions   Shellfish-Derived Products Anaphylaxis   Depakote [Valproic Acid]    Lexapro [Escitalopram Oxalate]    Metadate Cd [Methylphenidate]    Zyprexa [Olanzapine]     ROS   OBJECTIVE:    There were no vitals filed for this visit.   There is no height or weight on file to calculate BMI.  Physical Exam  Labs and Microbiology: CMP Latest Ref Rng & Units 04/05/2021 09/25/2020 02/28/2020  Glucose 65 - 99 mg/dL 93 99 85  BUN 7 - 25 mg/dL _0 Creatinine 0.60 - 1.35 mg/dL 1.21 1.11 1.41(H)  Sodium 135 - 146 mmol/L 139 141 138  Potassium 3.5 - 5.3 mmol/L 4.1 3.9 3.9  Chloride 98 - 110 mmol/L 104 106 103  CO2 20 - 32 mmol/L _1 Calcium 8.6 - 10.3 mg/dL 9.6 9.3 9.5  Total Protein 6.1 - 8.1 g/dL 7.3 7.2 7.5  Total Bilirubin 0.2 - 1.2 mg/dL 0.6 0.8 0.9  Alkaline Phos 38 - 126 U/L - - -  AST 10 - 40 U/L _2 ALT 9 - 46 U/L 21 21  21   CBC Latest Ref Rng & Units 04/05/2021 09/25/2020 02/28/2020  WBC 3.8 - 10.8 Thousand/uL 2.6(L) 2.6(L) 3.2(L)  Hemoglobin 13.2 - 17.1 g/dL 14.1 13.9 13.8  Hematocrit 38.5 - 50.0 % 40.8 39.5 39.7  Platelets 140 - 400 Thousand/uL 157 160 174     Lab Results  Component Value Date   HIV1RNAQUANT 22 (H) 04/05/2021   HIV1RNAQUANT <20 09/25/2020   HIV1RNAQUANT <20 DETECTED (A) 02/28/2020   CD4TABS 301 (L) 04/05/2021   CD4TABS 320 (L) 09/25/2020   CD4TABS 322 (L) 02/28/2020    RPR and STI: Lab Results  Component Value Date   LABRPR NON-REACTIVE 04/05/2021   LABRPR NON-REACTIVE 02/28/2020    STI Results GC CT  04/20/2021 Negative Negative  04/09/2018 Negative Negative  04/09/2018 Negative Negative  03/15/2018 Negative Negative  04/03/2017 **POSITIVE**(A) **POSITIVE**(A)    Hepatitis B: Lab Results  Component Value Date   HEPBSAG NON-REACTIVE 04/05/2021   Hepatitis C: No results found for: HEPCAB, HCVRNAPCRQN Hepatitis A: Lab Results  Component Value Date   HAV Negative 03/16/2018   Lipids: Lab Results   Component Value Date   CHOL 198 04/05/2021   TRIG 212 (H) 04/05/2021   HDL 39 (L) 04/05/2021   CHOLHDL 5.1 (H) 04/05/2021   LDLCALC 126 (H) 04/05/2021    Imaging: ***   ASSESSMENT & PLAN:    No problem-specific Assessment & Plan notes found for this encounter.   No orders of the defined types were placed in this encounter.      *** Vaccines Influenza: give every year COVID: recommend vaccination if not already done Pneumovax-23: (if CD4 >200) give twice every 5 years apart before age 52, then once at age 69.  Give >8 weeks from [place] Prevnar-13: (preferably when CD4 >200) give once, give >1 year from last Pneumovax-23 Hepatitis A: give Havrix 2 dose series at 0 and 6-12 months if non-immune Hepatitis B: give Heplisav 2 dose series at 0 and 4 weeks if non-immune.  Repeat serology 2 months after vaccine and revaccinate if needed MenACWY: 2 dose primary series 8 weeks apart, then 1 dose booster every 5 years HPV: Gardasil-9 at 0, 2, and 6 months for ages 9-26 should be vaccinated.  Ages 105-45 should be offered if appropriate Tdap: give every 10 years Shingles: give Shingrix 2 dose series at 0 and 2-6 months if >50 years on ART with CD4 cell count >200 Varicella: primary vaccination may be considered in VZV seronegative persons aged >8 years (if CD4 >200)  MMR: vaccine should be given if born in [birth year] or after and do not have immunity (if CD4 >200)  Screening DEXA Scan: if age >39 Quantiferon: check at initiation of care Hepatitis C: check at initiation of care.  Screen annually if risk factors HLA B5701: check at initiation of care G6PD: check if starting therapy with oxidant drugs Lipids: check annually Urinalysis: check annually or every 6 months if on tenofovir Hgb A1c: check annually  ASCVD Risk Score Consider high-intensity statin therapy if 10-year ASCVD risk score >7.5% The ASCVD Risk score (Arnett DK, et al., 2019) failed to calculate for the following  reasons:   The 2019 ASCVD risk score is only valid for ages 63 to [place] [facility] [place] 08/23/2021, 8:35 AM  HIV: Check labs today and continue Biktarvy daily.  Discussed need for continued adherence.  Vaccines: Received 1st dose of hepatitis A in June 2022.  Due for  2nd dose at follow up.  Discussed Covid and flu vaccines and he ***.  STI/Screening: Will screen today.  Encounter for medication monitoring: ***

## 2021-09-20 ENCOUNTER — Encounter: Payer: Self-pay | Admitting: Internal Medicine

## 2022-04-29 ENCOUNTER — Ambulatory Visit: Payer: Self-pay

## 2022-04-29 ENCOUNTER — Other Ambulatory Visit: Payer: Self-pay

## 2022-04-29 ENCOUNTER — Other Ambulatory Visit (HOSPITAL_COMMUNITY): Payer: Self-pay

## 2022-04-29 ENCOUNTER — Ambulatory Visit (INDEPENDENT_AMBULATORY_CARE_PROVIDER_SITE_OTHER): Payer: Self-pay | Admitting: Infectious Diseases

## 2022-04-29 ENCOUNTER — Encounter: Payer: Self-pay | Admitting: Infectious Diseases

## 2022-04-29 VITALS — BP 124/77 | HR 60 | Temp 97.8°F | Wt 194.0 lb

## 2022-04-29 DIAGNOSIS — B20 Human immunodeficiency virus [HIV] disease: Secondary | ICD-10-CM

## 2022-04-29 DIAGNOSIS — Z21 Asymptomatic human immunodeficiency virus [HIV] infection status: Secondary | ICD-10-CM

## 2022-04-29 MED ORDER — BIKTARVY 50-200-25 MG PO TABS: 1.0000 | ORAL_TABLET | Freq: Every day | ORAL | 5 refills | Status: DC

## 2022-04-29 NOTE — Assessment & Plan Note (Signed)
Uncontrolled with intermittent adherence and retention in care since 2019. Will resume Biktarvy once daily for him and check genotype to ensure no reservations with continuing.  No Findings concerning for OI today or advancing disease.  Met with financial about ADAP and provided with 2 months of samples today.  No dental needs today.  No concern over anxious/depressed mood.  Sexual health discussed including screening needs. Politely declined any specific testing today.  Vaccines need to be updated at next visit.   FU in 4 weeks.    

## 2022-04-29 NOTE — Progress Notes (Signed)
Name: Brett Carter  DOB: [birth year] MRN: [id number] PCP: Patient, No Pcp Per    Brief Narrative:  Brett Carter is a 31 y.o. male living with HIV.  CD4 nadir 90 HIV Risk: sexual / MSM History of OIs: pneumonia, thrush  Intake Labs 2019: Hep B sAg (-), sAb (-), cAb (-); Hep A (-), Hep C (-) Quantiferon () HLA B*5701 () G6PD: ()   Previous Regimens: Biktarvy   Genotypes: Pending 04/29/2022  Subjective:   Chief Complaint  Patient presents with   Follow-up    B20 -      HPI: Brett Carter is here today to get back into care for HIV.  Fill History poor over the last 2 years with only 5 fills over 2022/2023. Intermittently taking medication and has been out of insurance for a while. Work has been challenging and got very busy for him but he is happy to be back to get his medication sorted out.   Has had some irritation from partials but otherwise no dental concerns today. No concerns over anxious or depressed mood.   Endorses no complaints today suggestive of associated opportunistic infection or advancing HIV disease such as fevers, night sweats, weight loss, anorexia, cough, SOB, nausea, vomiting, diarrhea, headache, sensory changes, lymphadenopathy or oral thrush.       04/29/2022   10:16 AM  Depression screen PHQ 2/9  Decreased Interest 0  Down, Depressed, Hopeless 0  PHQ - 2 Score 0    Review of Systems  Constitutional:  Negative for chills, fever, malaise/fatigue and weight loss.  HENT:  Negative for sore throat.        No dental problems  Respiratory:  Negative for cough and sputum production.   Cardiovascular:  Negative for chest pain and leg swelling.  Gastrointestinal:  Negative for abdominal pain, diarrhea and vomiting.  Genitourinary:  Negative for dysuria and flank pain.  Musculoskeletal:  Negative for joint pain, myalgias and neck pain.  Skin:  Negative for rash.  Neurological:  Negative for dizziness, tingling and headaches.  Psychiatric/Behavioral:   Negative for depression and substance abuse. The patient is not nervous/anxious and does not have insomnia.      Past Medical History:  Diagnosis Date   ADHD    HIV infection ([place])     Outpatient Medications Prior to Visit  Medication Sig Dispense Refill   lidocaine (XYLOCAINE) 2 % solution Use as directed 15 mLs in the mouth or throat every 4 (four) hours as needed for mouth pain. (Patient not taking: Reported on 04/29/2022) 150 mL 0   bictegravir-emtricitabine-tenofovir AF (BIKTARVY) 50-200-25 MG TABS tablet Take 1 tablet by mouth daily. (Patient not taking: Reported on 04/29/2022) 30 tablet 3   No facility-administered medications prior to visit.     Allergies  Allergen Reactions   Shellfish-Derived Products Anaphylaxis   Depakote [Valproic Acid]    Lexapro [Escitalopram Oxalate]    Metadate Cd [Methylphenidate]    Zyprexa [Olanzapine]     Social History   Tobacco Use   Smoking status: Former    Packs/day: 0.10    Types: Cigarettes   Smokeless tobacco: Never   Tobacco comments:    quit two weeks ago (04/20/2022  Vaping Use   Vaping Use: Never used  Substance Use Topics   Alcohol use: Yes   Drug use: No    Family History  Problem Relation Age of Onset   Diabetes Other    Hypertension Other    Healthy Mother  Social History   Substance and Sexual Activity  Sexual Activity Not Currently   Birth control/protection: Condom   Comment: accepted condoms     Objective:   Vitals:   04/29/22 1014  BP: 124/77  Pulse: 60  Temp: 97.8 F (36.6 C)  TempSrc: Oral  SpO2: 98%  Weight: 194 lb (88 kg)   Body mass index is 30.38 kg/m.  Physical Exam Vitals reviewed.  Constitutional:      Appearance: He is well-developed.     Comments: Seated comfortably in chair during visit.   HENT:     Mouth/Throat:     Dentition: Normal dentition. No dental abscesses.  Cardiovascular:     Rate and Rhythm: Normal rate and regular rhythm.     Heart sounds: Normal  heart sounds.  Pulmonary:     Effort: Pulmonary effort is normal.     Breath sounds: Normal breath sounds.  Abdominal:     General: There is no distension.     Palpations: Abdomen is soft.     Tenderness: There is no abdominal tenderness.  Lymphadenopathy:     Cervical: No cervical adenopathy.  Skin:    General: Skin is warm and dry.     Findings: No rash.  Neurological:     Mental Status: He is alert and oriented to person, place, and time.  Psychiatric:        Judgment: Judgment normal.     Comments: In good spirits today and engaged in care discussion.      Lab Results Lab Results  Component Value Date   WBC 3.3 (L) 04/29/2022   HGB 14.4 04/29/2022   HCT 41.9 04/29/2022   MCV 87.1 04/29/2022   PLT 181 04/29/2022    Lab Results  Component Value Date   CREATININE 1.21 04/05/2021   BUN 19 04/05/2021   NA 139 04/05/2021   K 4.1 04/05/2021   CL 104 04/05/2021   CO2 29 04/05/2021    Lab Results  Component Value Date   ALT 21 04/05/2021   AST 24 04/05/2021   ALKPHOS 47 03/15/2018   BILITOT 0.6 04/05/2021    Lab Results  Component Value Date   CHOL 198 04/05/2021   HDL 39 (L) 04/05/2021   LDLCALC 126 (H) 04/05/2021   TRIG 212 (H) 04/05/2021   CHOLHDL 5.1 (H) 04/05/2021   HIV 1 RNA Quant  Date Value  04/05/2021 22 Copies/mL (H)  09/25/2020 <20 Copies/mL  02/28/2020 <20 DETECTED copies/mL (A)   CD4 T Cell Abs (/uL)  Date Value  04/05/2021 301 (L)  09/25/2020 320 (L)  02/28/2020 322 (L)     Assessment & Plan:   Problem List Items Addressed This Visit       Unprioritized   HIV (human immunodeficiency virus infection) ([facility]) - Primary (Chronic)    Uncontrolled with intermittent adherence and retention in care since 2019. Will resume Biktarvy once daily for him and check genotype to ensure no reservations with continuing.  No Findings concerning for OI today or advancing disease.  Met with financial about ADAP and provided with 2 months of samples  today.  No dental needs today.  No concern over anxious/depressed mood.  Sexual health discussed including screening needs. Politely declined any specific testing today.  Vaccines need to be updated at next visit.   FU in 4 weeks.        Relevant Medications   bictegravir-emtricitabine-tenofovir AF (BIKTARVY) 50-200-25 MG TABS tablet   Other Relevant Orders   Hepatitis  B surface antibody,qualitative   T-helper cells (CD4) count   COMPLETE METABOLIC PANEL WITH GFR   CBC (Completed)   RPR   HIV-1 Genotyping (RTI,PI,IN [facility])   Other Visit Diagnoses     Asymptomatic HIV infection ([place])  (Chronic)      Relevant Medications   bictegravir-emtricitabine-tenofovir AF (BIKTARVY) 50-200-25 MG TABS tablet       Janene Madeira, MSN, NP-C [facility] for Infect[facility] [place] Pager: [phone] Office: [phone]  04/29/22  5:00 PM

## 2022-04-29 NOTE — Patient Instructions (Addendum)
Wonderful to see you back!   Please call Claris Che @ [phone] to schedule a dental appointment   Will plan to have you resume your Biktavy. Will update your labs (knowing it is a starting point) and see you back in 1 month.   If any medication changes need to be made based on your labs I will let you know ahead of time.

## 2022-04-30 LAB — T-HELPER CELLS (CD4) COUNT (NOT AT ARMC)
CD4 % Helper T Cell: 24 % — ABNORMAL LOW (ref 33–65)
CD4 T Cell Abs: 454 /uL (ref 400–1790)

## 2022-05-02 ENCOUNTER — Other Ambulatory Visit: Payer: Self-pay | Admitting: Pharmacist

## 2022-05-02 DIAGNOSIS — B20 Human immunodeficiency virus [HIV] disease: Secondary | ICD-10-CM

## 2022-05-02 MED ORDER — BICTEGRAVIR-EMTRICITAB-TENOFOV 50-200-25 MG PO TABS: 1.0000 | ORAL_TABLET | Freq: Every day | ORAL | 0 refills | Status: AC

## 2022-05-02 NOTE — Progress Notes (Signed)
Medication Samples have been provided to the patient.  Drug name: Biktarvy        Strength: 50/200/25 mg       Qty: 14 tablets (2 bottles) LOT: [id number]   Exp.Date: 9/25  Dosing instructions: Take one tablet by mouth once daily  The patient has been instructed regarding the correct time, dose, and frequency of taking this medication, including desired effects and most common side effects.   Anissia Wessells, PharmD, CPP, BCIDP Clinical Pharmacist Practitioner Infectious Diseases Clinical Pharmacist [facility]  

## 2022-05-08 LAB — COMPLETE METABOLIC PANEL WITH GFR
AG Ratio: 1.6 (calc) (ref 1.0–2.5)
ALT: 20 U/L (ref 9–46)
AST: 22 U/L (ref 10–40)
Albumin: 4.7 g/dL (ref 3.6–5.1)
Alkaline phosphatase (APISO): 52 U/L (ref 36–130)
BUN: 10 mg/dL (ref 7–25)
CO2: 27 mmol/L (ref 20–32)
Calcium: 9.1 mg/dL (ref 8.6–10.3)
Chloride: 106 mmol/L (ref 98–110)
Creat: 1.14 mg/dL (ref 0.60–1.26)
Globulin: 2.9 g/dL (calc) (ref 1.9–3.7)
Glucose, Bld: 77 mg/dL (ref 65–99)
Potassium: 3.6 mmol/L (ref 3.5–5.3)
Sodium: 141 mmol/L (ref 135–146)
Total Bilirubin: 0.5 mg/dL (ref 0.2–1.2)
Total Protein: 7.6 g/dL (ref 6.1–8.1)
eGFR: 87 mL/min/{1.73_m2} (ref >=60)

## 2022-05-08 LAB — HIV-1 GENOTYPING (RTI,PI,IN INHBTR): HIV-1 Genotype: NOT DETECTED

## 2022-05-08 LAB — CBC
HCT: 41.9 % (ref 38.5–50.0)
Hemoglobin: 14.4 g/dL (ref 13.2–17.1)
MCH: 29.9 pg (ref 27.0–33.0)
MCHC: 34.4 g/dL (ref 32.0–36.0)
MCV: 87.1 fL (ref 80.0–100.0)
MPV: 11.1 fL (ref 7.5–12.5)
Platelets: 181 10*3/uL (ref 140–400)
RBC: 4.81 10*6/uL (ref 4.20–5.80)
RDW: 12.4 % (ref 11.0–15.0)
WBC: 3.3 10*3/uL — ABNORMAL LOW (ref 3.8–10.8)

## 2022-05-08 LAB — RPR: RPR Ser Ql: NONREACTIVE

## 2022-05-08 LAB — HEPATITIS B SURFACE ANTIBODY,QUALITATIVE: Hep B S Ab: REACTIVE — AB

## 2022-08-05 ENCOUNTER — Ambulatory Visit: Payer: Self-pay | Admitting: Infectious Diseases

## 2023-06-23 ENCOUNTER — Other Ambulatory Visit: Payer: Self-pay | Admitting: Infectious Diseases

## 2023-06-23 DIAGNOSIS — Z21 Asymptomatic human immunodeficiency virus [HIV] infection status: Secondary | ICD-10-CM

## 2023-06-24 ENCOUNTER — Telehealth: Payer: Self-pay

## 2023-06-24 NOTE — Telephone Encounter (Signed)
Received refill request for Biktarvy. Patient is overdue for appointment and will need to schedule follow up for refills. Number listed is no longer in service.  Brett Carter, RMA

## 2023-07-07 ENCOUNTER — Ambulatory Visit: Payer: Self-pay | Admitting: Internal Medicine

## 2023-07-09 ENCOUNTER — Encounter: Payer: Self-pay | Admitting: Internal Medicine

## 2023-07-09 ENCOUNTER — Ambulatory Visit (INDEPENDENT_AMBULATORY_CARE_PROVIDER_SITE_OTHER): Payer: Self-pay | Admitting: Internal Medicine

## 2023-07-09 ENCOUNTER — Other Ambulatory Visit: Payer: Self-pay

## 2023-07-09 VITALS — BP 113/79 | HR 67 | Resp 16 | Ht 67.0 in | Wt 216.0 lb

## 2023-07-09 DIAGNOSIS — Z21 Asymptomatic human immunodeficiency virus [HIV] infection status: Secondary | ICD-10-CM

## 2023-07-09 DIAGNOSIS — B20 Human immunodeficiency virus [HIV] disease: Secondary | ICD-10-CM

## 2023-07-09 MED ORDER — BIKTARVY 50-200-25 MG PO TABS: 1.0000 | ORAL_TABLET | Freq: Every day | ORAL | 2 refills | Status: DC

## 2023-07-09 NOTE — Addendum Note (Signed)
Addended byDanelle Earthly on: 07/09/2023 01:36 PM   Modules accepted: Level of Service

## 2023-07-09 NOTE — Progress Notes (Signed)
[facility]     HPI: Brett Carter is a 32 y.o. 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 Noteshe always uses contraception Social->new job at [employer]  Past Medical History:  Diagnosis Date   ADHD    HIV infection (HCC)     Past Surgical History:  Procedure Laterality Date   RECTAL SURGERY      Family History  Problem Relation Age of Onset   Diabetes Other    Hypertension Other    Healthy Mother    Current Outpatient Medications on File Prior to Visit  Medication Sig Dispense Refill   bictegravir-emtricitabine-tenofovir AF (BIKTARVY) 50-200-25 MG TABS tablet Take 1 tablet by mouth daily. 30 tablet 5   lidocaine (XYLOCAINE) 2 % solution Use as directed 15 mLs in the mouth or throat every 4 (four) hours as needed for mouth pain. (Patient not taking: Reported on 04/29/2022) 150 mL 0   No current facility-administered medications on file prior to visit.    Allergies  Allergen Reactions   Shellfish-Derived Products Anaphylaxis   Depakote [Valproic Acid]    Lexapro [Escitalopram Oxalate]    Metadate Cd [Methylphenidate]    Zyprexa [Olanzapine]       Lab Results HIV 1 RNA Quant  Date Value  04/05/2021 22 Copies/mL (H)  09/25/2020 <20 Copies/mL  02/28/2020 <20 DETECTED copies/mL (A)   CD4 T Cell Abs (/uL)  Date Value  04/29/2022 454  04/05/2021 301 (L)  09/25/2020 320 (L)   No results found for: "HIV1GENOSEQ" Lab Results  Component Value Date   WBC 3.3 (L) 04/29/2022   HGB 14.4 04/29/2022   HCT 41.9 04/29/2022   MCV 87.1 04/29/2022   PLT 181 04/29/2022    Lab Results  Component Value Date   CREATININE 1.14 04/29/2022   BUN 10 04/29/2022   NA 141 04/29/2022   K 3.6 04/29/2022   CL 106 04/29/2022   CO2 27 04/29/2022   Lab Results   Component Value Date   ALT 20 04/29/2022   AST 22 04/29/2022   ALKPHOS 47 03/15/2018   BILITOT 0.5 04/29/2022    Lab Results  Component Value Date   CHOL 198 04/05/2021   TRIG 212 (H) 04/05/2021   HDL 39 (L) 04/05/2021   LDLCALC 126 (H) 04/05/2021   Lab Results  Component Value Date   HAV Negative 03/16/2018   Lab Results  Component Value Date   HEPBSAG NON-REACTIVE 04/05/2021   HEPBSAB REACTIVE (A) 04/29/2022   No results found for: "HCVAB" Lab Results  Component Value Date   CHLAMYDIAWP Negative 04/20/2021   N Negative 04/20/2021   No results found for: "GCPROBEAPT" No results found for: "QUANTGOLD"  Assessment/Plan #HIV/Asymptomatic #Non-adherence to ART -CD4 454, VL nd, on 7/10/223 -off of ART x 1 month. Counseled on adherence Plan -Continue biktarvy -labs today -Follow up in on month with ID in one month on 10/21 for repeat labs  #Vaccination COVID not UTD Flu-Declined Discuss at next visit: Monkeypox PCV Meningitis HepA-serology  today 07/09/23 HEpB Tdap Shingles  #Health maintenance -Quantiferon today 07/09/23 -RPR  today 07/09/23 -HCV  today 07/09/23 -GC  today 07/09/23 -Dysplasia screen M <35    Danelle Earthly, MD [facility]  I have personally spent  45 minutes involved in face-to-face and non-face-to-face activities for this patient on the day of the visit. Professional time spent includes the following activities: Preparing to see the patient (review of tests), Obtaining and/or reviewing separately obtained history (admission/discharge record), Performing a medically appropriate examination and/or evaluation , Ordering medications/tests/procedures, referring and communicating with other health care professionals, Documenting clinical information in the EMR, Independently interpreting results (not separately reported), Communicating results to the patient/family/caregiver, Counseling and  educating the patient/family/caregiver and Care coordination (not separately reported).

## 2023-07-10 LAB — URINE CYTOLOGY ANCILLARY ONLY
Chlamydia: NEGATIVE
Comment: NEGATIVE
Comment: NORMAL
Neisseria Gonorrhea: NEGATIVE

## 2023-07-11 LAB — T-HELPER CELLS (CD4) COUNT (NOT AT ARMC)
CD4 % Helper T Cell: 25 % — ABNORMAL LOW (ref 33–65)
CD4 T Cell Abs: 489 /uL (ref 400–1790)

## 2023-07-12 LAB — HIV-1 RNA QUANT-NO REFLEX-BLD
HIV 1 RNA Quant: <20 Copies/mL — ABNORMAL HIGH
HIV-1 RNA Quant, Log: <1.3 {Log_copies}/mL — ABNORMAL HIGH

## 2023-07-12 LAB — COMPLETE METABOLIC PANEL WITH GFR
AG Ratio: 1.7 (calc) (ref 1.0–2.5)
ALT: 22 U/L (ref 9–46)
AST: 24 U/L (ref 10–40)
Albumin: 4.7 g/dL (ref 3.6–5.1)
Alkaline phosphatase (APISO): 52 U/L (ref 36–130)
BUN: 12 mg/dL (ref 7–25)
CO2: 26 mmol/L (ref 20–32)
Calcium: 9.5 mg/dL (ref 8.6–10.3)
Chloride: 104 mmol/L (ref 98–110)
Creat: 1.17 mg/dL (ref 0.60–1.26)
Globulin: 2.7 g/dL (calc) (ref 1.9–3.7)
Glucose, Bld: 99 mg/dL (ref 65–99)
Potassium: 3.8 mmol/L (ref 3.5–5.3)
Sodium: 140 mmol/L (ref 135–146)
Total Bilirubin: 0.5 mg/dL (ref 0.2–1.2)
Total Protein: 7.4 g/dL (ref 6.1–8.1)
eGFR: 84 mL/min/{1.73_m2} (ref >=60)

## 2023-07-12 LAB — QUANTIFERON-TB GOLD PLUS
Mitogen-NIL: >10 IU/mL
NIL: 0.01 IU/mL
QuantiFERON-TB Gold Plus: NEGATIVE
TB1-NIL: 0 IU/mL
TB2-NIL: 0.05 IU/mL

## 2023-07-12 LAB — CBC
HCT: 40.9 % (ref 38.5–50.0)
Hemoglobin: 14.1 g/dL (ref 13.2–17.1)
MCH: 30.2 pg (ref 27.0–33.0)
MCHC: 34.5 g/dL (ref 32.0–36.0)
MCV: 87.6 fL (ref 80.0–100.0)
MPV: 11.7 fL (ref 7.5–12.5)
Platelets: 184 10*3/uL (ref 140–400)
RBC: 4.67 10*6/uL (ref 4.20–5.80)
RDW: 12.8 % (ref 11.0–15.0)
WBC: 3.6 10*3/uL — ABNORMAL LOW (ref 3.8–10.8)

## 2023-07-12 LAB — HEPATITIS A ANTIBODY, TOTAL: Hepatitis A AB,Total: REACTIVE — AB

## 2023-07-12 LAB — HEPATITIS C ANTIBODY: Hepatitis C Ab: NONREACTIVE

## 2023-07-12 LAB — RPR: RPR Ser Ql: NONREACTIVE

## 2023-08-11 ENCOUNTER — Ambulatory Visit: Payer: Self-pay | Admitting: Infectious Diseases

## 2023-08-13 ENCOUNTER — Telehealth: Payer: Self-pay

## 2023-08-13 NOTE — Telephone Encounter (Signed)
Patient has lab appointment scheduled for next week and follow up with Judeth Cornfield in November. He was just seen 06/2023 and per Judeth Cornfield does not need to return for 4 months.   Called [facility] to see if he would like to reschedule those appointments 4 months out, no answer. Left HIPAA compliant voicemail requesting callback.   Sandie Ano, RN

## 2023-08-20 ENCOUNTER — Ambulatory Visit: Payer: Self-pay

## 2023-08-20 ENCOUNTER — Other Ambulatory Visit: Payer: Medicaid Other

## 2023-09-02 ENCOUNTER — Ambulatory Visit: Payer: Self-pay | Admitting: Infectious Diseases

## 2023-09-04 ENCOUNTER — Ambulatory Visit: Payer: Self-pay

## 2023-09-22 ENCOUNTER — Ambulatory Visit: Payer: Self-pay

## 2023-09-22 ENCOUNTER — Other Ambulatory Visit: Payer: Self-pay

## 2023-09-22 ENCOUNTER — Other Ambulatory Visit: Payer: Medicaid Other

## 2023-09-22 DIAGNOSIS — Z21 Asymptomatic human immunodeficiency virus [HIV] infection status: Secondary | ICD-10-CM

## 2023-09-22 DIAGNOSIS — Z113 Encounter for screening for infections with a predominantly sexual mode of transmission: Secondary | ICD-10-CM

## 2023-10-07 ENCOUNTER — Ambulatory Visit: Payer: Medicaid Other | Admitting: Internal Medicine

## 2023-10-07 ENCOUNTER — Telehealth: Payer: Self-pay

## 2023-10-07 NOTE — Telephone Encounter (Signed)
Called patient to reschedule missed appointment. No answer left HIPAA compliant voicemail to contact office.

## 2023-10-22 ENCOUNTER — Other Ambulatory Visit: Payer: Self-pay

## 2023-10-22 ENCOUNTER — Encounter (HOSPITAL_COMMUNITY): Payer: Self-pay

## 2023-10-22 ENCOUNTER — Emergency Department (HOSPITAL_COMMUNITY)
Admission: EM | Admit: 2023-10-22 | Discharge: 2023-10-22 | Disposition: A | Discharge status: Home / Self Care | Payer: Self-pay | Attending: Emergency Medicine | Admitting: Emergency Medicine

## 2023-10-22 ENCOUNTER — Emergency Department (HOSPITAL_COMMUNITY): Payer: Self-pay

## 2023-10-22 DIAGNOSIS — Z21 Asymptomatic human immunodeficiency virus [HIV] infection status: Secondary | ICD-10-CM | POA: Insufficient documentation

## 2023-10-22 DIAGNOSIS — Z20822 Contact with and (suspected) exposure to covid-19: Secondary | ICD-10-CM | POA: Insufficient documentation

## 2023-10-22 DIAGNOSIS — B349 Viral infection, unspecified: Secondary | ICD-10-CM | POA: Insufficient documentation

## 2023-10-22 LAB — RESP PANEL BY RT-PCR (RSV, FLU A&B, COVID)  RVPGX2
Influenza A by PCR: NEGATIVE
Influenza B by PCR: NEGATIVE
Resp Syncytial Virus by PCR: NEGATIVE
SARS Coronavirus 2 by RT PCR: NEGATIVE

## 2023-10-22 NOTE — ED Provider Notes (Signed)
 [place] EMERGENCY DEPARTMENT AT [facility] Provider Note   CSN: [id number] Arrival date & time: 10/22/23  1420     History  Chief Complaint  Patient presents with   Cough   Chills   Generalized Body Aches    Brett Carter is a 33 y.o. male past medical history significant for HIV who is compliant with his antiviral medication presents with concern for cough, chills, generalized bodyaches, fever for 6 days.  He works at [employer].    Cough      Home Medications Prior to Admission medications   Medication Sig Start Date End Date Taking? Authorizing Provider  bictegravir-emtricitabine -tenofovir  AF (BIKTARVY ) 50-200-25 MG TABS tablet Take 1 tablet by mouth daily. 07/09/23   Dennise Kingsley, MD  lidocaine  (XYLOCAINE ) 2 % solution Use as directed 15 mLs in the mouth or throat every 4 (four) hours as needed for mouth pain. Patient not taking: Reported on 04/29/2022 08/21/21   Jarold Olam HERO, PA-C      Allergies    Shellfish-derived products, Depakote [valproic acid], Lexapro [escitalopram oxalate], Metadate cd [methylphenidate], and Zyprexa [olanzapine]    Review of Systems   Review of Systems  Respiratory:  Positive for cough.   All other systems reviewed and are negative.   Physical Exam Updated Vital Signs BP (!) 144/82   Pulse 85   Temp 98.5 F (36.9 C) (Oral)   Resp 17   Ht 5' 7 (1.702 m)   Wt 98 kg   SpO2 98%   BMI 33.84 kg/m  Physical Exam Vitals and nursing note reviewed.  Constitutional:      General: He is not in acute distress.    Appearance: Normal appearance.  HENT:     Head: Normocephalic and atraumatic.     Mouth/Throat:     Comments: No significant posterior oropharynx erythema, swelling, exudate. Uvula midline, tonsils 1+ bilaterally.  No trismus, stridor, evidence of PTA, floor of mouth swelling or redness.  Eyes:     General:        Right eye: No discharge.        Left eye: No discharge.  Cardiovascular:     Rate and Rhythm:  Normal rate and regular rhythm.     Heart sounds: No murmur heard.    No friction rub. No gallop.  Pulmonary:     Effort: Pulmonary effort is normal.     Breath sounds: Normal breath sounds.     Comments: No wheezing, rhonchi, stridor, rales Abdominal:     General: Bowel sounds are normal.     Palpations: Abdomen is soft.  Skin:    General: Skin is warm and dry.     Capillary Refill: Capillary refill takes less than 2 seconds.  Neurological:     Mental Status: He is alert and oriented to person, place, and time.  Psychiatric:        Mood and Affect: Mood normal.        Behavior: Behavior normal.     ED Results / Procedures / Treatments   Labs (all labs ordered are listed, but only abnormal results are displayed) Labs Reviewed  RESP PANEL BY RT-PCR (RSV, FLU A&B, COVID)  [id number]    EKG None  Radiology No results found.  Procedures Procedures    Medications Ordered in ED Medications - No data to display  ED Course/ Medical Decision Making/ A&P  Medical Decision Making Amount and/or Complexity of Data Reviewed Radiology: ordered.   This is a well-appearing 33 yo male who presents with concern for 6 days of cough, fever, sore throat, headache.  My emergent differential diagnosis includes acute upper respiratory infection with COVID, flu, RSV versus new asthma presentation, acute bronchitis, less clinical concern for pneumonia.  Also considered other ENT emergencies, Ludwig angina, strep pharyngitis, mono, versus epiglottis, tonsillitis versus other.  This is not an exhaustive differential.  On my exam patient is overall well-appearing, they have temperature of 98.5, breathing unlabored, no tachypnea, no respiratory distress, stable oxygen saturation.  Patient without tachycardia.  RVP independently reviewed by myself shows negative for COVID, flu, RSV.  I independently interpreted plain film chest x-ray which shows no evidence of pneumonia  or other intrathoracic abnormality..  Patient symptoms are consistent with viral upper respiratory infection encouraged ibuprofen, Tylenol , rest, plenty of fluids.  Discussed extensive return precautions.  Patient discharged in stable condition at this time.  Final Clinical Impression(s) / ED Diagnoses Final diagnoses:  Viral illness    Rx / DC Orders ED Discharge Orders     None         Rosan Sherlean DEL, PA-C 10/22/23 1913    Ruthe Cornet, DO 10/22/23 [id number]

## 2023-10-22 NOTE — Discharge Instructions (Addendum)
 Please use Tylenol  or ibuprofen for pain.  You may use 600 mg ibuprofen every 6 hours or 1000 mg of Tylenol  every 6 hours.  You may choose to alternate between the 2.  This would be most effective.  Not to exceed 4 g of Tylenol  within 24 hours.  Not to exceed 3200 mg ibuprofen 24 hours.  Please continue take over-the-counter cough and cold medication, please return to the ED if you have significant worsening of symptoms despite treatment.  Take some time to rest and drink plenty of fluids.

## 2023-10-22 NOTE — ED Triage Notes (Signed)
 Pt reports with cough, chills, generalized body aches, and fever x 6 days. Pt reports working at [employer].

## 2023-12-30 ENCOUNTER — Other Ambulatory Visit: Payer: Self-pay | Admitting: Internal Medicine

## 2023-12-30 DIAGNOSIS — Z21 Asymptomatic human immunodeficiency virus [HIV] infection status: Secondary | ICD-10-CM

## 2024-01-13 ENCOUNTER — Ambulatory Visit: Admitting: Internal Medicine

## 2024-01-21 NOTE — Progress Notes (Deleted)
   HPI: Brett Carter is a 33 y.o. male who presents to the [facility] pharmacy clinic for HIV follow-up.  Patient Active Problem List   Diagnosis Date Noted   Not immune to hepatitis B virus 12/22/2018   Healthcare maintenance 03/30/2018   HIV (human immunodeficiency virus infection) (HCC) 03/15/2018    Patient's Medications  New Prescriptions   No medications on file  Previous Medications   BICTEGRAVIR-EMTRICITABINE-TENOFOVIR AF (BIKTARVY) 50-200-25 MG TABS TABLET    Take 1 tablet by mouth daily. NO FURTHER REFILLS UNTIL SEEN IN OFFICE   LIDOCAINE (XYLOCAINE) 2 % SOLUTION    Use as directed 15 mLs in the mouth or throat every 4 (four) hours as needed for mouth pain.  Modified Medications   No medications on file  Discontinued Medications   No medications on file    Labs: Lab Results  Component Value Date   HIV1RNAQUANT <20 (H) 07/09/2023   HIV1RNAQUANT 22 (H) 04/05/2021   HIV1RNAQUANT <20 09/25/2020   CD4TABS 489 07/09/2023   CD4TABS 454 04/29/2022   CD4TABS 301 (L) 04/05/2021    RPR and STI Lab Results  Component Value Date   LABRPR NON-REACTIVE 07/09/2023   LABRPR NON-REACTIVE 04/29/2022   LABRPR NON-REACTIVE 04/05/2021   LABRPR NON-REACTIVE 02/28/2020    STI Results GC CT  07/09/2023  8:38 AM Negative  Negative   04/20/2021  7:18 PM Negative  Negative   04/09/2018 12:00 AM Negative    Negative  Negative    Negative   03/15/2018 12:00 AM Negative  Negative   04/03/2017 12:00 AM **POSITIVE**  **POSITIVE**     Hepatitis B Lab Results  Component Value Date   HEPBSAB REACTIVE (A) 04/29/2022   HEPBSAG NON-REACTIVE 04/05/2021   Hepatitis C Lab Results  Component Value Date   HEPCAB NON-REACTIVE 07/09/2023   Hepatitis A Lab Results  Component Value Date   HAV REACTIVE (A) 07/09/2023   Lipids: Lab Results  Component Value Date   CHOL 198 04/05/2021   TRIG 212 (H) 04/05/2021   HDL 39 (L) 04/05/2021   CHOLHDL 5.1 (H) 04/05/2021   LDLCALC 126 (H)  04/05/2021    Current HIV Regimen: Biktarvy  Assessment: Brett Carter comes in today for HIV follow up. Last saw Dr. Thedore Mins on 07/09/23 where he had been off of Biktarvy x 1 month due to being busy at work. Despite this, he was still undetectable at that time and CD4 count was 489. He has had intermittent adherence with spotty refill history (10/26/22, 12/26/22, 02/20/23, 04/22/23, 07/09/23, 08/04/23, 10/16/23). Seems to only fill over other month or so.   Prevnar, Menveo,  tdap  Plan: ***  Navina Wohlers L. Teion Ballin, PharmD, BCIDP, AAHIVP, CPP Clinical Pharmacist Practitioner Infectious Diseases Clinical Pharmacist [facility] 01/21/2024, 9:15 PM

## 2024-01-22 ENCOUNTER — Ambulatory Visit: Admitting: Pharmacist

## 2024-01-22 ENCOUNTER — Other Ambulatory Visit (HOSPITAL_COMMUNITY): Payer: Self-pay

## 2024-01-22 DIAGNOSIS — Z79899 Other long term (current) drug therapy: Secondary | ICD-10-CM

## 2024-01-22 DIAGNOSIS — Z21 Asymptomatic human immunodeficiency virus [HIV] infection status: Secondary | ICD-10-CM

## 2024-01-22 DIAGNOSIS — Z113 Encounter for screening for infections with a predominantly sexual mode of transmission: Secondary | ICD-10-CM

## 2024-02-17 ENCOUNTER — Other Ambulatory Visit: Payer: Self-pay | Admitting: Internal Medicine

## 2024-02-17 DIAGNOSIS — Z21 Asymptomatic human immunodeficiency virus [HIV] infection status: Secondary | ICD-10-CM

## 2024-02-18 ENCOUNTER — Telehealth: Payer: Self-pay

## 2024-02-18 NOTE — Telephone Encounter (Signed)
 Called Pt in attempt to reschedule recent missed appt on 01/22/2024 with our Pharm team for a f/u. Unable to reach and left a VM.

## 2024-03-02 ENCOUNTER — Other Ambulatory Visit: Payer: Self-pay | Admitting: Internal Medicine

## 2024-03-02 DIAGNOSIS — Z21 Asymptomatic human immunodeficiency virus [HIV] infection status: Secondary | ICD-10-CM

## 2024-03-10 NOTE — Progress Notes (Signed)
 HPI: Brett Carter is a 33 y.o. male who presents to the [facility] pharmacy clinic for HIV follow-up.  Patient Active Problem List   Diagnosis Date Noted   Not immune to hepatitis B virus 12/22/2018   Healthcare maintenance 03/30/2018   HIV (human immunodeficiency virus infection) (HCC) 03/15/2018    Patient's Medications  New Prescriptions   No medications on file  Previous Medications   BIKTARVY  50-200-25 MG TABS TABLET    TAKE 1 TABLET BY MOUTH DAILY   LIDOCAINE  (XYLOCAINE ) 2 % SOLUTION    Use as directed 15 mLs in the mouth or throat every 4 (four) hours as needed for mouth pain.  Modified Medications   No medications on file  Discontinued Medications   No medications on file    Labs: Lab Results  Component Value Date   HIV1RNAQUANT <20 (H) 07/09/2023   HIV1RNAQUANT 22 (H) 04/05/2021   HIV1RNAQUANT <20 09/25/2020   CD4TABS 489 07/09/2023   CD4TABS 454 04/29/2022   CD4TABS 301 (L) 04/05/2021    RPR and STI Lab Results  Component Value Date   LABRPR NON-REACTIVE 07/09/2023   LABRPR NON-REACTIVE 04/29/2022   LABRPR NON-REACTIVE 04/05/2021   LABRPR NON-REACTIVE 02/28/2020    STI Results GC CT  07/09/2023  8:38 AM Negative  Negative   04/20/2021  7:18 PM Negative  Negative   04/09/2018 12:00 AM Negative    Negative  Negative    Negative   03/15/2018 12:00 AM Negative  Negative   04/03/2017 12:00 AM **POSITIVE**  **POSITIVE**     Hepatitis B Lab Results  Component Value Date   HEPBSAB REACTIVE (A) 04/29/2022   HEPBSAG NON-REACTIVE 04/05/2021   Hepatitis C Lab Results  Component Value Date   HEPCAB NON-REACTIVE 07/09/2023   Hepatitis A Lab Results  Component Value Date   HAV REACTIVE (A) 07/09/2023   Lipids: Lab Results  Component Value Date   CHOL 198 04/05/2021   TRIG 212 (H) 04/05/2021   HDL 39 (L) 04/05/2021   CHOLHDL 5.1 (H) 04/05/2021   LDLCALC 126 (H) 04/05/2021    Current HIV Regimen: Biktarvy   Assessment: Brett Carter presents today for  HIV follow up. Last visit was with Dr. Zelda Hickman on 07/09/23. At that visit patient reported being off of Biktarvy  for 1 month due to being busy with work. HIV RNA was <20 and CD4 count was 489. CMP and CBC were stable. Urine cytology and RPR were negative. Patient was instructed to restart Biktarvy  and counseled on importance of adherence. Since then patient has no showed many [facility] appointments. Fill history shows Biktarvy  was filled on 10/15/24 for a 30 day supply and then again on 01/22/24 for a 30 day supply.  Today patient reports his barriers to care are his busy work schedule and helping take care of his grandma. Reports he has not taken Biktarvy  in 35 days. Says he tried to fill it at [facility] but was told he could not pick it up. Offered [facility] mail order pharmacy to help with adherence, but he prefers to pick up medication in person. Counseled him on importance of taking Biktarvy  every day to have an undetectable viral load and advised him to reach out to clinic if he is unable to get the medication in the future. He declines STI testing today as he has had no new partners in the past 3 months. He is due for HIV RNA, CD4 count, and lipid panel today. Discussed eligibility for Prevnar 20, Shingrix, Menveo booster, and Tdap. He  declines all vaccines today.  Plan: - HIV RNA, CD4 count, and lipid panel today - Continue Biktarvy  - Follow up with Brett Carter on 05/11/24  Georga Killings, PharmD PGY-1 Pharmacy Resident

## 2024-03-11 ENCOUNTER — Other Ambulatory Visit: Payer: Self-pay

## 2024-03-11 ENCOUNTER — Ambulatory Visit (INDEPENDENT_AMBULATORY_CARE_PROVIDER_SITE_OTHER): Payer: Self-pay | Admitting: Pharmacist

## 2024-03-11 DIAGNOSIS — B20 Human immunodeficiency virus [HIV] disease: Secondary | ICD-10-CM

## 2024-03-11 DIAGNOSIS — Z13220 Encounter for screening for lipoid disorders: Secondary | ICD-10-CM

## 2024-03-11 DIAGNOSIS — Z21 Asymptomatic human immunodeficiency virus [HIV] infection status: Secondary | ICD-10-CM

## 2024-03-11 DIAGNOSIS — Z113 Encounter for screening for infections with a predominantly sexual mode of transmission: Secondary | ICD-10-CM

## 2024-03-11 MED ORDER — BIKTARVY 50-200-25 MG PO TABS: 1.0000 | ORAL_TABLET | Freq: Every day | ORAL | 1 refills | Status: DC

## 2024-03-13 LAB — LIPID PANEL
Cholesterol: 226 mg/dL — ABNORMAL HIGH (ref <200)
HDL: 35 mg/dL — ABNORMAL LOW (ref >=40)
LDL Cholesterol (Calc): 148 mg/dL — ABNORMAL HIGH
Non-HDL Cholesterol (Calc): 191 mg/dL — ABNORMAL HIGH (ref <130)
Total CHOL/HDL Ratio: 6.5 (calc) — ABNORMAL HIGH (ref <5.0)
Triglycerides: 264 mg/dL — ABNORMAL HIGH (ref <150)

## 2024-03-13 LAB — HIV-1 RNA QUANT-NO REFLEX-BLD
HIV 1 RNA Quant: <20 {copies}/mL — AB
HIV-1 RNA Quant, Log: <1.3 {Log_copies}/mL — AB

## 2024-03-13 LAB — T-HELPER CELLS (CD4) COUNT (NOT AT ARMC)
Absolute CD4: 458 {cells}/uL — ABNORMAL LOW (ref 490–1740)
CD4 T Helper %: 22 % — ABNORMAL LOW (ref 30–61)
Total lymphocyte count: 2080 {cells}/uL (ref 850–3900)

## 2024-05-11 ENCOUNTER — Ambulatory Visit: Admitting: Infectious Diseases

## 2024-08-26 ENCOUNTER — Other Ambulatory Visit: Payer: Self-pay | Admitting: Pharmacist

## 2024-08-26 DIAGNOSIS — Z21 Asymptomatic human immunodeficiency virus [HIV] infection status: Secondary | ICD-10-CM

## 2024-09-06 ENCOUNTER — Telehealth: Payer: Self-pay

## 2024-09-06 NOTE — Telephone Encounter (Signed)
 Brett Carter is on the scheduling list and due for a follow-up appt. I attempted to make contact and LVM requesting a call back. No recent MyChart activity.

## 2024-11-18 ENCOUNTER — Ambulatory Visit: Payer: Self-pay | Admitting: Internal Medicine

## 2024-11-18 ENCOUNTER — Ambulatory Visit: Payer: Self-pay
